# Patient Record
Sex: Male | Born: 1985 | Race: Black or African American | Hispanic: No | Marital: Single | Smoking: Never smoker
Health system: Southern US, Community
[De-identification: ages and names within clinical notes are randomized; demographics above are authoritative.]

## PROBLEM LIST (undated history)

## (undated) DIAGNOSIS — IMO0001 Reserved for inherently not codable concepts without codable children: Secondary | ICD-10-CM

## (undated) HISTORY — PX: MOUTH SURGERY: SHX715

---
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
## 2006-09-05 ENCOUNTER — Emergency Department (HOSPITAL_COMMUNITY): Admission: EM | Admit: 2006-09-05 | Discharge: 2006-09-05 | Payer: Self-pay | Admitting: Emergency Medicine

## 2006-12-04 ENCOUNTER — Emergency Department (HOSPITAL_COMMUNITY): Admission: EM | Admit: 2006-12-04 | Discharge: 2006-12-04 | Payer: Self-pay | Admitting: Emergency Medicine

## 2006-12-25 ENCOUNTER — Emergency Department (HOSPITAL_COMMUNITY): Admission: EM | Admit: 2006-12-25 | Discharge: 2006-12-25 | Payer: Self-pay | Admitting: Emergency Medicine

## 2012-02-22 ENCOUNTER — Emergency Department (HOSPITAL_COMMUNITY)
Admission: EM | Admit: 2012-02-22 | Discharge: 2012-02-22 | Disposition: A | Discharge status: Home / Self Care | Payer: Self-pay | Attending: Emergency Medicine | Admitting: Emergency Medicine

## 2012-02-22 ENCOUNTER — Encounter (HOSPITAL_COMMUNITY): Payer: Self-pay | Admitting: Emergency Medicine

## 2012-02-22 DIAGNOSIS — IMO0001 Reserved for inherently not codable concepts without codable children: Secondary | ICD-10-CM | POA: Insufficient documentation

## 2012-02-22 DIAGNOSIS — M545 Low back pain, unspecified: Secondary | ICD-10-CM

## 2012-02-22 DIAGNOSIS — M5430 Sciatica, unspecified side: Secondary | ICD-10-CM | POA: Insufficient documentation

## 2012-02-22 DIAGNOSIS — M5432 Sciatica, left side: Secondary | ICD-10-CM

## 2012-02-22 HISTORY — DX: Reserved for inherently not codable concepts without codable children: IMO0001

## 2012-02-22 MED ORDER — PREDNISONE 20 MG PO TABS
60.0000 mg | ORAL_TABLET | Freq: Once | ORAL | Status: AC
  Administered 2012-02-22: 60 mg via ORAL
  Filled 2012-02-22: qty 3

## 2012-02-22 MED ORDER — CYCLOBENZAPRINE HCL 10 MG PO TABS
10.0000 mg | ORAL_TABLET | Freq: Once | ORAL | Status: AC
  Administered 2012-02-22: 10 mg via ORAL
  Filled 2012-02-22: qty 1

## 2012-02-22 MED ORDER — PREDNISONE 20 MG PO TABS: 60.0000 mg | ORAL_TABLET | Freq: Once | ORAL | Status: AC

## 2012-02-22 MED ORDER — CYCLOBENZAPRINE HCL 10 MG PO TABS: 10.0000 mg | ORAL_TABLET | Freq: Three times a day (TID) | ORAL | Status: AC | PRN

## 2012-02-22 NOTE — ED Provider Notes (Signed)
History     CSN: [id number]  Arrival date & time 02/22/12  1919   First MD Initiated Contact with Patient 02/22/12 2054      Chief Complaint  Patient presents with  . Back Pain    (Consider location/radiation/quality/duration/timing/severity/associated sxs/prior treatment) HPI Comments: Patient here with lower back pain - states that has a history of chronic lower back pain with radiation down his left leg to his knee - states no fever, chills, nausea, vomiting, loss of control of bowels or bladder, weakness, numbness or tingling.  Pain worse with movement.  Patient is a 26 y.o. male presenting with back pain. The history is provided by the patient. No language interpreter was used.  Back Pain  This is a new problem. The current episode started yesterday. The problem occurs constantly. The problem has not changed since onset.The pain is associated with no known injury. The pain is present in the lumbar spine. The quality of the pain is described as stabbing and shooting. The pain radiates to the left thigh. The pain is at a severity of 5/10. The pain is moderate. The symptoms are aggravated by bending and twisting. The pain is the same all the time. Stiffness is present all day. Associated symptoms include leg pain. Pertinent negatives include no chest pain, no fever, no numbness, no weight loss, no headaches, no abdominal pain, no abdominal swelling, no bowel incontinence, no perianal numbness, no bladder incontinence, no dysuria, no pelvic pain, no paresthesias, no paresis, no tingling and no weakness. He has tried nothing for the symptoms. The treatment provided no relief.    Past Medical History  Diagnosis Date  . No significant past medical history     Past Surgical History  Procedure Date  . Mouth surgery     History reviewed. No pertinent family history.  History  Substance Use Topics  . Smoking status: Never Smoker   . Smokeless tobacco: Not on file  . Alcohol Use: No       Review of Systems  Constitutional: Negative for fever and weight loss.  Cardiovascular: Negative for chest pain.  Gastrointestinal: Negative for abdominal pain and bowel incontinence.  Genitourinary: Negative for bladder incontinence, dysuria and pelvic pain.  Musculoskeletal: Positive for back pain.  Neurological: Negative for tingling, weakness, numbness, headaches and paresthesias.  All other systems reviewed and are negative.    Allergies  Review of patient's allergies indicates no known allergies.  Home Medications  No current outpatient prescriptions on file.  BP 127/74  Pulse 99  Temp(Src) 98.5 F (36.9 C) (Oral)  Resp 18  SpO2 98%  Physical Exam  Nursing note and vitals reviewed. Constitutional: He is oriented to person, place, and time. He appears well-developed and well-nourished. No distress.  HENT:  Head: Normocephalic and atraumatic.  Right Ear: External ear normal.  Left Ear: External ear normal.  Nose: Nose normal.  Mouth/Throat: Oropharynx is clear and moist. No oropharyngeal exudate.  Eyes: Conjunctivae are normal. Pupils are equal, round, and reactive to light. No scleral icterus.  Neck: Normal range of motion. Neck supple. Muscular tenderness present. No spinous process tenderness present.    Cardiovascular: Normal rate, regular rhythm and normal heart sounds.  Exam reveals no gallop and no friction rub.   No murmur heard. Pulmonary/Chest: Effort normal and breath sounds normal. He exhibits no tenderness.  Abdominal: Soft. Bowel sounds are normal. He exhibits no distension. There is no tenderness.  Musculoskeletal: Normal range of motion. He exhibits tenderness. He exhibits no  edema.       Lumbar back: He exhibits tenderness, pain and spasm. He exhibits normal range of motion and no bony tenderness.       Back:  Lymphadenopathy:    He has no cervical adenopathy.  Neurological: He is alert and oriented to person, place, and time. No cranial  nerve deficit.  Skin: Skin is warm and dry. No rash noted. No erythema. No pallor.  Psychiatric: He has a normal mood and affect. His behavior is normal. Judgment and thought content normal.    ED Course  Procedures (including critical care time)  Labs Reviewed - No data to display No results found.   Sciatica    MDM  Patient with MSK lower back pain without evidence of neurological deficits.  I do not suspect cauda equina or epidural hematoma.        Izola Price [place], [place] 02/22/12 2155

## 2012-02-22 NOTE — ED Notes (Addendum)
Patient complaining of lower back pain; describes pain as "sharp" which radiates down his leg (to his left knee) and up his spine up to his neck.  Pain is chronic; patient reports having intermittent pain for years but that the pain became increasingly worse this morning.  Patient denies any injury.

## 2012-02-22 NOTE — Discharge Instructions (Signed)
Back Pain, Adult Low back pain is very common. About 1 in 5 people have back pain.The cause of low back pain is rarely dangerous. The pain often gets better over time.About half of people with a sudden onset of back pain feel better in just 2 weeks. About 8 in 10 people feel better by 6 weeks.  CAUSES Some common causes of back pain include:  Strain of the muscles or ligaments supporting the spine.   Wear and tear (degeneration) of the spinal discs.   Arthritis.   Direct injury to the back.  DIAGNOSIS Most of the time, the direct cause of low back pain is not known.However, back pain can be treated effectively even when the exact cause of the pain is unknown.Answering your caregiver's questions about your overall health and symptoms is one of the most accurate ways to make sure the cause of your pain is not dangerous. If your caregiver needs more information, he or she may order lab work or imaging tests (X-rays or MRIs).However, even if imaging tests show changes in your back, this usually does not require surgery. HOME CARE INSTRUCTIONS For many people, back pain returns.Since low back pain is rarely dangerous, it is often a condition that people can learn to manageon their own.   Remain active. It is stressful on the back to sit or stand in one place. Do not sit, drive, or stand in one place for more than 30 minutes at a time. Take short walks on level surfaces as soon as pain allows.Try to increase the length of time you walk each day.   Do not stay in bed.Resting more than 1 or 2 days can delay your recovery.   Do not avoid exercise or work.Your body is made to move.It is not dangerous to be active, even though your back may hurt.Your back will likely heal faster if you return to being active before your pain is gone.   Pay attention to your body when you bend and lift. Many people have less discomfortwhen lifting if they bend their knees, keep the load close to their  bodies,and avoid twisting. Often, the most comfortable positions are those that put less stress on your recovering back.   Find a comfortable position to sleep. Use a firm mattress and lie on your side with your knees slightly bent. If you lie on your back, put a pillow under your knees.   Only take over-the-counter or prescription medicines as directed by your caregiver. Over-the-counter medicines to reduce pain and inflammation are often the most helpful.Your caregiver may prescribe muscle relaxant drugs.These medicines help dull your pain so you can more quickly return to your normal activities and healthy exercise.   Put ice on the injured area.   Put ice in a plastic bag.   Place a towel between your skin and the bag.   Leave the ice on for 15 to 20 minutes, 3 to 4 times a day for the first 2 to 3 days. After that, ice and heat may be alternated to reduce pain and spasms.   Ask your caregiver about trying back exercises and gentle massage. This may be of some benefit.   Avoid feeling anxious or stressed.Stress increases muscle tension and can worsen back pain.It is important to recognize when you are anxious or stressed and learn ways to manage it.Exercise is a great option.  SEEK MEDICAL CARE IF:  You have pain that is not relieved with rest or medicine.   You have   relieved with rest or medicine.   You have pain that does not improve in 1 week.   You have new symptoms.   You are generally not feeling well.  SEEK IMMEDIATE MEDICAL CARE IF:    You have pain that radiates from your back into your legs.   You develop new bowel or bladder control problems.   You have unusual weakness or numbness in your arms or legs.   You develop nausea or vomiting.   You develop abdominal pain.   You feel faint.  Document Released: 11/12/2005 Document Revised: 11/01/2011 Document Reviewed: 04/02/2011  Exit[employer] Patient Information 2012 [employer]  Sciatica  Sciatica is a weakness and/or changes in sensation (tingling, jolts, hot and cold, numbness) along  the path the sciatic nerve travels. Irritation or damage to lumbar nerve roots is often also referred to as lumbar radiculopathy.   Lumbar radiculopathy (Sciatica) is the most common form of this problem. Radiculopathy can occur in any of the nerves coming out of the spinal cord. The problems caused depend on which nerves are involved. The sciatic nerve is the large nerve supplying the branches of nerves going from the hip to the toes. It often causes a numbness or weakness in the skin and/or muscles that the sciatic nerve serves. It also may cause symptoms (problems) of pain, burning, tingling, or electric shock-like feelings in the path of this nerve. This usually comes from injury to the fibers that make up the sciatic nerve. Some of these symptoms are low back pain and/or unpleasant feelings in the following areas:   From the mid-buttock down the back of the leg to the back of the knee.   And/or the outside of the calf and top of the foot.   And/or behind the inner ankle to the sole of the foot.  CAUSES    Herniated or slipped disc. Discs are the little cushions between the bones in the back.   Pressure by the piriformis muscle in the buttock on the sciatic nerve (Piriformis Syndrome).   Misalignment of the bones in the lower back and buttocks (Sacroiliac Joint Derangement).   Narrowing of the spinal canal that puts pressure on or pinches the fibers that make up the sciatic nerve.   A slipped vertebra that is out of line with those above or beneath it.   Abnormality of the nervous system itself so that nerve fibers do not transmit signals properly, especially to feet and calves (neuropathy).   Tumor (this is rare).  Your caregiver can usually determine the cause of your sciatica and begin the treatment most likely to help you.  TREATMENT   Taking over-the-counter painkillers, physical therapy, rest, exercise, spinal manipulation, and injections of anesthetics and/or steroids may be used. Surgery,  acupuncture, and Yoga can also be effective. Mind over matter techniques, mental imagery, and changing factors such as your bed, chair, desk height, posture, and activities are other treatments that may be helpful. You and your caregiver can help determine what is best for you. With proper diagnosis, the cause of most sciatica can be identified and removed. Communication and cooperation between your caregiver and you is essential. If you are not successful immediately, do not be discouraged. With time, a proper treatment can be found that will make you comfortable.  HOME CARE INSTRUCTIONS    If the pain is coming from a problem in the back, applying ice to that area for 15 to 20 minutes, 3 to 4 times per day while   awake, may be helpful. Put the ice in a plastic bag. Place a towel between the bag of ice and your skin.   You may exercise or perform your usual activities if these do not aggravate your pain, or as suggested by your caregiver.   Only take over-the-counter or prescription medicines for pain, discomfort, or fever as directed by your caregiver.   If your caregiver has given you a follow-up appointment, it is very important to keep that appointment. Not keeping the appointment could result in a chronic or permanent injury, pain, and disability. If there is any problem keeping the appointment, you must call back to this facility for assistance.  SEEK IMMEDIATE MEDICAL CARE IF:    You experience loss of control of bowel or bladder.   You have increasing weakness in the trunk, buttocks, or legs.   There is numbness in any areas from the hip down to the toes.   You have difficulty walking or keeping your balance.   You have any of the above, with fever or forceful vomiting.  Document Released: 11/06/2001 Document Revised: 11/01/2011 Document Reviewed: 06/25/2008  [employer] Patient Information 2012 [employer].

## 2012-02-22 NOTE — ED Provider Notes (Signed)
Medical screening examination/treatment/procedure(s) were performed by non-physician practitioner and as supervising physician I was immediately available for consultation/collaboration.  Juliet Rude. Rubin Payor, MD 02/22/12 2324

## 2012-12-02 ENCOUNTER — Emergency Department (HOSPITAL_COMMUNITY): Payer: Self-pay

## 2012-12-02 ENCOUNTER — Encounter (HOSPITAL_COMMUNITY): Payer: Self-pay | Admitting: Emergency Medicine

## 2012-12-02 ENCOUNTER — Emergency Department (HOSPITAL_COMMUNITY)
Admission: EM | Admit: 2012-12-02 | Discharge: 2012-12-03 | Disposition: A | Discharge status: Home / Self Care | Payer: Self-pay | Attending: Emergency Medicine | Admitting: Emergency Medicine

## 2012-12-02 DIAGNOSIS — R112 Nausea with vomiting, unspecified: Secondary | ICD-10-CM | POA: Insufficient documentation

## 2012-12-02 DIAGNOSIS — J029 Acute pharyngitis, unspecified: Secondary | ICD-10-CM | POA: Insufficient documentation

## 2012-12-02 DIAGNOSIS — J3489 Other specified disorders of nose and nasal sinuses: Secondary | ICD-10-CM | POA: Insufficient documentation

## 2012-12-02 DIAGNOSIS — B9789 Other viral agents as the cause of diseases classified elsewhere: Secondary | ICD-10-CM | POA: Insufficient documentation

## 2012-12-02 DIAGNOSIS — R52 Pain, unspecified: Secondary | ICD-10-CM | POA: Insufficient documentation

## 2012-12-02 DIAGNOSIS — E860 Dehydration: Secondary | ICD-10-CM | POA: Insufficient documentation

## 2012-12-02 DIAGNOSIS — M549 Dorsalgia, unspecified: Secondary | ICD-10-CM | POA: Insufficient documentation

## 2012-12-02 DIAGNOSIS — B349 Viral infection, unspecified: Secondary | ICD-10-CM

## 2012-12-02 DIAGNOSIS — Z79899 Other long term (current) drug therapy: Secondary | ICD-10-CM | POA: Insufficient documentation

## 2012-12-02 LAB — RAPID STREP SCREEN (MED CTR MEBANE ONLY): Streptococcus, Group A Screen (Direct): NEGATIVE

## 2012-12-02 MED ORDER — HYDROCODONE-ACETAMINOPHEN 5-325 MG PO TABS
1.0000 | ORAL_TABLET | Freq: Once | ORAL | Status: AC
  Administered 2012-12-02: 1 via ORAL
  Filled 2012-12-02: qty 1

## 2012-12-02 NOTE — ED Notes (Signed)
Pt alert, arrives from home, c/o body aches, fever, onset last week, seen in [facility], returns with cont fever, body aches, resp even unlabored, skin pwd

## 2012-12-02 NOTE — ED Provider Notes (Signed)
History     CSN: [id number]  Arrival date & time 12/02/12  1191   First MD Initiated Contact with Patient 12/02/12 2215      Chief Complaint  Patient presents with  . Generalized Body Aches  . Fever   HPI  History provided by the patient. Patient is a 27 year old male with no significant PMH who presents with multiple complaints of fever, chills, bodyaches and sore throat for the past week. Symptoms began gradually and have been persistent. They have been associated with a few episodes of vomiting. Denies any diarrhea symptoms. Patient also reports slight occasional productive cough. Sore throat is worse with swallowing. Patient has taken Flexeril and prednisone he was given previously for his back pain without any improvement of symptoms. He denies any other aggravating or alleviating factors. Denies any other associated symptoms. Denies any recent travel or known sick contacts.    Past Medical History  Diagnosis Date  . No significant past medical history     Past Surgical History  Procedure Date  . Mouth surgery     No family history on file.  History  Substance Use Topics  . Smoking status: Never Smoker   . Smokeless tobacco: Not on file  . Alcohol Use: No      Review of Systems  Constitutional: Positive for fever and chills. Negative for appetite change.  HENT: Positive for sore throat and rhinorrhea. Negative for ear pain.   Respiratory: Positive for cough.   Gastrointestinal: Positive for nausea and vomiting. Negative for abdominal pain.  Musculoskeletal: Positive for myalgias and back pain.  All other systems reviewed and are negative.    Allergies  Review of patient's allergies indicates no known allergies.  Home Medications   Current Outpatient Rx  Name  Route  Sig  Dispense  Refill  . CYCLOBENZAPRINE HCL 10 MG PO TABS   Oral   Take 10 mg by mouth 3 (three) times daily as needed. Muscle spasms         . IBUPROFEN 200 MG PO TABS   Oral   Take  200 mg by mouth every 6 (six) hours as needed. Pain         . PREDNISONE 20 MG PO TABS   Oral   Take 60 mg by mouth daily.           BP 114/70  Pulse 108  Temp 99.2 F (37.3 C) (Oral)  Resp 17  Wt 170 lb (77.111 kg)  SpO2 98%  Physical Exam  Nursing note and vitals reviewed. Constitutional: He is oriented to person, place, and time. He appears well-developed and well-nourished. No distress.  HENT:  Head: Normocephalic.       Bilateral tonsillar edema with erythema and exudate. Uvula midline. No signs for PTA.  Cardiovascular: Normal rate and regular rhythm.   Pulmonary/Chest: Effort normal and breath sounds normal. No respiratory distress. He has no wheezes. He has no rales.  Abdominal: Soft. There is no tenderness. There is no rebound and no guarding.  Musculoskeletal:       Cervical back: Normal.       Thoracic back: Normal.       Lumbar back: He exhibits tenderness. He exhibits no bony tenderness.       Back:  Lymphadenopathy:    He has cervical adenopathy.  Neurological: He is alert and oriented to person, place, and time.  Skin: Skin is warm.    ED Course  Procedures   Results for orders  placed during the hospital encounter of 12/02/12  URINALYSIS, ROUTINE W REFLEX MICROSCOPIC      Component Value Range   Color, Urine AMBER (*) YELLOW   APPearance CLOUDY (*) CLEAR   Specific Gravity, Urine 1.041 (*) 1.005 - 1.030   pH 6.0  5.0 - 8.0   Glucose, UA NEGATIVE  NEGATIVE mg/dL   Hgb urine dipstick NEGATIVE  NEGATIVE   Bilirubin Urine SMALL (*) NEGATIVE   Ketones, ur 40 (*) NEGATIVE mg/dL   Protein, ur 161 (*) NEGATIVE mg/dL   Urobilinogen, UA 1.0  0.0 - 1.0 mg/dL   Nitrite NEGATIVE  NEGATIVE   Leukocytes, UA NEGATIVE  NEGATIVE  RAPID STREP SCREEN      Component Value Range   Streptococcus, Group A Screen (Direct) NEGATIVE  NEGATIVE  URINE MICROSCOPIC-ADD ON      Component Value Range   Squamous Epithelial / LPF FEW (*) RARE   Bacteria, UA FEW (*) RARE     Urine-Other MUCOUS PRESENT    POCT I-STAT, CHEM 8      Component Value Range   Sodium 139  135 - 145 mEq/L   Potassium 3.4 (*) 3.5 - 5.1 mEq/L   Chloride 100  96 - 112 mEq/L   BUN 9  6 - 23 mg/dL   Creatinine, Ser 0.96  0.50 - 1.35 mg/dL   Glucose, Bld 045 (*) 70 - 99 mg/dL   Calcium, Ion 4.09  8.11 - 1.23 mmol/L   TCO2 28  0 - 100 mmol/L   Hemoglobin 15.0  13.0 - 17.0 g/dL   HCT 91.4  78.2 - 95.6 %      Dg Lumbar Spine Complete  12/02/2012  *RADIOLOGY REPORT*  Clinical Data: Low back pain radiating into both legs.  LUMBAR SPINE - COMPLETE 4+ VIEW  Comparison: Plain films 12/25/2006.  Findings: Vertebral body height and alignment are normal.  No pars interarticularis defect is identified.  Intervertebral disc space height is maintained.  There is straightening of the normal lumbar lordosis.  IMPRESSION: No acute finding.  Straightening of the normal lumbar lordosis.   Original Report Authenticated By: Holley Dexter, M.D.      1. Viral infection   2. Dehydration       MDM  Patient seen and evaluated. Patient well-appearing and nontoxic.        [name], [place] 12/03/12 [phone]

## 2012-12-02 NOTE — ED Notes (Signed)
Pt c/o back pain which radiates down the back of both legs into his groin area and knees.  Pt has had cold sweats and fever since Sunday.  Patient has sore throat and vomited one time tonight.  Denies diarrhea.  Not having nausea now.  Pt had same kind of back pain last year and was given a muscle relaxer and prednisone.  Pt has been taking his medication from last year without relief.  No injury reported.  No pain or problems urinating.  Pt coughing up yellow phlegm occasionally.

## 2012-12-03 LAB — POCT I-STAT, CHEM 8
Calcium, Ion: 1.14 mmol/L (ref 1.12–1.23)
Creatinine, Ser: 1.3 mg/dL (ref 0.50–1.35)
Hemoglobin: 15 g/dL (ref 13.0–17.0)
Sodium: 139 mEq/L (ref 135–145)
TCO2: 28 mmol/L (ref 0–100)

## 2012-12-03 LAB — URINALYSIS, ROUTINE W REFLEX MICROSCOPIC
Glucose, UA: NEGATIVE mg/dL
Hgb urine dipstick: NEGATIVE
Leukocytes, UA: NEGATIVE
Specific Gravity, Urine: 1.041 — ABNORMAL HIGH (ref 1.005–1.030)
Urobilinogen, UA: 1 mg/dL (ref 0.0–1.0)

## 2012-12-03 LAB — URINE MICROSCOPIC-ADD ON

## 2012-12-03 MED ORDER — SODIUM CHLORIDE 0.9 % IV BOLUS (SEPSIS)
1000.0000 mL | Freq: Once | INTRAVENOUS | Status: AC
  Administered 2012-12-03: 1000 mL via INTRAVENOUS

## 2012-12-03 NOTE — ED Notes (Signed)
Peripheral IV to R AC started at 0059 during downtime by A Ward, RN

## 2012-12-03 NOTE — ED Provider Notes (Signed)
Medical screening examination/treatment/procedure(s) were performed by non-physician practitioner and as supervising physician I was immediately available for consultation/collaboration.  Marwan T Powers, MD 12/03/12 2349 

## 2012-12-03 NOTE — ED Notes (Signed)
Patient unable to urinate at this time. 

## 2012-12-05 ENCOUNTER — Emergency Department (HOSPITAL_COMMUNITY)
Admission: EM | Admit: 2012-12-05 | Discharge: 2012-12-05 | Disposition: A | Discharge status: Home / Self Care | Payer: Self-pay | Attending: Emergency Medicine | Admitting: Emergency Medicine

## 2012-12-05 ENCOUNTER — Encounter (HOSPITAL_COMMUNITY): Payer: Self-pay | Admitting: Emergency Medicine

## 2012-12-05 DIAGNOSIS — J029 Acute pharyngitis, unspecified: Secondary | ICD-10-CM | POA: Insufficient documentation

## 2012-12-05 DIAGNOSIS — R220 Localized swelling, mass and lump, head: Secondary | ICD-10-CM | POA: Insufficient documentation

## 2012-12-05 DIAGNOSIS — R1310 Dysphagia, unspecified: Secondary | ICD-10-CM | POA: Insufficient documentation

## 2012-12-05 DIAGNOSIS — R6883 Chills (without fever): Secondary | ICD-10-CM | POA: Insufficient documentation

## 2012-12-05 MED ORDER — ACETAMINOPHEN-CODEINE 120-12 MG/5ML PO SOLN: 10.0000 mL | ORAL | Status: DC | PRN

## 2012-12-05 MED ORDER — AMOXICILLIN 875 MG PO TABS: 875.0000 mg | ORAL_TABLET | Freq: Two times a day (BID) | ORAL | Status: DC

## 2012-12-05 NOTE — ED Provider Notes (Signed)
History     CSN: [id number]  Arrival date & time 12/05/12  1124   First MD Initiated Contact with Patient 12/05/12 1149      Chief Complaint  Patient presents with  . Sore Throat    (Consider location/radiation/quality/duration/timing/severity/associated sxs/prior treatment) HPI Patient presents to the emergency department today complaining of continuing sore throat, dysphagia, and tonsillar swelling for the past 5 days. He was seen here 2 days ago, and during that visit he was dehydrated, had an elevated temperature, and had been vomiting. Since then, the vomiting has ceased, and now the only thing that is bothering him is his throat. He describes the pain as burning and rates it a 7/10. He feels like his tonsils are swollen and partially obstructing his ability to swallow, and occasionally obstructs his breathing. He also complains of chills at night. He has tried warm lemon water gargles, which stings. He has not tried any other over-the-counter medications.   Past Medical History  Diagnosis Date  . No significant past medical history     Past Surgical History  Procedure Date  . Mouth surgery     No family history on file.  History  Substance Use Topics  . Smoking status: Never Smoker   . Smokeless tobacco: Not on file  . Alcohol Use: No      Review of Systems ROSAll other systems negative except as documented in the HPI. All pertinent positives and negatives as reviewed in the HPI.  Allergies  Review of patient's allergies indicates no known allergies.  Home Medications   Current Outpatient Rx  Name  Route  Sig  Dispense  Refill  . CYCLOBENZAPRINE HCL 10 MG PO TABS   Oral   Take 10 mg by mouth 3 (three) times daily as needed. Muscle spasms         . PREDNISONE 20 MG PO TABS   Oral   Take 60 mg by mouth daily.           BP 125/59  Pulse 114  Temp 98.6 F (37 C) (Oral)  Resp 20  SpO2 100%  Physical Exam  Constitutional: He is oriented to  person, place, and time. Vital signs are normal. He appears well-developed and well-nourished.  HENT:  Head: Normocephalic and atraumatic.  Mouth/Throat: Uvula is midline. Oropharyngeal exudate, posterior oropharyngeal edema and posterior oropharyngeal erythema present.  Eyes: Pupils are equal, round, and reactive to light.  Neck: Neck supple.  Cardiovascular: Regular rhythm, S1 normal and S2 normal.  Tachycardia present.        Split S2  Pulmonary/Chest: Effort normal and breath sounds normal. No respiratory distress.  Lymphadenopathy:    He has cervical adenopathy.  Neurological: He is alert and oriented to person, place, and time.  Skin: Skin is warm and dry. He is not diaphoretic.    ED Course  Procedures (including critical care time)   Labs Reviewed  RAPID STREP SCREEN   Patient will be treated for pharyngitis. The patient is advised to increase his fluids. Patient will be given symptomatic relief.   MDM          Carlyle Dolly, PA-C 12/05/12 [phone]

## 2012-12-05 NOTE — ED Notes (Signed)
Pt presenting to ed with c/o sore throat pt states he was seen here x 3 days ago for the same pt states he doesn't feel any better he thinks that his symptoms are worse.

## 2012-12-05 NOTE — Progress Notes (Signed)
Pt listed with no insurance coverage CM and [facility] liaison spoke with pt.  Pt offered services to assist with finding a [place] self pay provider & health reform information 

## 2012-12-08 NOTE — ED Provider Notes (Signed)
Medical screening examination/treatment/procedure(s) were performed by non-physician practitioner and as supervising physician I was immediately available for consultation/collaboration.   Richardean Canal, MD 12/08/12 [phone]

## 2013-05-26 ENCOUNTER — Emergency Department (HOSPITAL_COMMUNITY)
Admission: EM | Admit: 2013-05-26 | Discharge: 2013-05-27 | Disposition: A | Discharge status: Home / Self Care | Payer: BC Managed Care – PPO | Attending: Emergency Medicine | Admitting: Emergency Medicine

## 2013-05-26 DIAGNOSIS — W230XXA Caught, crushed, jammed, or pinched between moving objects, initial encounter: Secondary | ICD-10-CM | POA: Insufficient documentation

## 2013-05-26 DIAGNOSIS — Y929 Unspecified place or not applicable: Secondary | ICD-10-CM | POA: Insufficient documentation

## 2013-05-26 DIAGNOSIS — Y939 Activity, unspecified: Secondary | ICD-10-CM | POA: Insufficient documentation

## 2013-05-26 DIAGNOSIS — S92911A Unspecified fracture of right toe(s), initial encounter for closed fracture: Secondary | ICD-10-CM

## 2013-05-26 DIAGNOSIS — S92919A Unspecified fracture of unspecified toe(s), initial encounter for closed fracture: Secondary | ICD-10-CM | POA: Insufficient documentation

## 2013-05-26 NOTE — ED Provider Notes (Signed)
History    This chart was scribed for non-physician practitioner Junious Silk, PA working with Olivia Mackie, MD by Quintella Reichert, ED Scribe. This patient was seen in room WTR9/WTR9 and the patient's care was started at 11:50 PM .  CSN: [id number]  Arrival date & time 05/26/13  2347    Chief Complaint  Patient presents with  . Toe Injury    The history is provided by the patient. No language interpreter was used.    HPI Comments: Bryan Walsh is a 27 y.o. male who presents to the Emergency Department complaining of a right great toe injury that he sustained 6 hours ago.  Pt reports that he accidentally jammed the toe on a trash can and immediately developed constant, severe pain to the toe, with accompanying visible swelling and bruising.  Pain is exacerbated by movement and bearing weight.  He has attempted to treat pain with ibuprofen, without relief.  He denies h/o prior injuries to the toe.  He denies fever, chills, abdominal pain, nausea, emesis, CP, SOB or any other associated symptoms.   Past Medical History  Diagnosis Date  . No significant past medical history    Past Surgical History  Procedure Laterality Date  . Mouth surgery     No family history on file. History  Substance Use Topics  . Smoking status: Never Smoker   . Smokeless tobacco: Not on file  . Alcohol Use: No    Review of Systems  Constitutional: Negative for fever and chills.  Respiratory: Negative for shortness of breath.   Cardiovascular: Negative for chest pain.  Gastrointestinal: Negative for nausea, vomiting and abdominal pain.  Musculoskeletal:       Right big toe pain and swelling  All other systems reviewed and are negative.    Allergies  Review of patient's allergies indicates no known allergies.  Home Medications   Current Outpatient Rx  Name  Route  Sig  Dispense  Refill  . acetaminophen-codeine 120-12 MG/5ML solution   Oral   Take 10 mLs by mouth every 4 (four) hours  as needed for pain.   120 mL   0   . amoxicillin (AMOXIL) 875 MG tablet   Oral   Take 1 tablet (875 mg total) by mouth 2 (two) times daily.   20 tablet   0   . cyclobenzaprine (FLEXERIL) 10 MG tablet   Oral   Take 10 mg by mouth 3 (three) times daily as needed. Muscle spasms         . predniSONE (DELTASONE) 20 MG tablet   Oral   Take 60 mg by mouth daily.          BP 142/91  Pulse 89  Temp(Src) 99 F (37.2 C) (Oral)  Resp 17  SpO2 100%  Physical Exam  Nursing note and vitals reviewed. Constitutional: He is oriented to person, place, and time. He appears well-developed and well-nourished. No distress.  HENT:  Head: Normocephalic and atraumatic.  Right Ear: External ear normal.  Left Ear: External ear normal.  Nose: Nose normal.  Eyes: Conjunctivae are normal.  Neck: Normal range of motion. No tracheal deviation present.  Cardiovascular: Normal rate, regular rhythm and normal heart sounds.   Pulmonary/Chest: Effort normal and breath sounds normal. No stridor.  Abdominal: Soft. He exhibits no distension. There is no tenderness.  Musculoskeletal: Normal range of motion.  Swollen right great toe with bruising and tenderness to palpation. ROM limited due to pain. Compartment soft.  Neurological:  He is alert and oriented to person, place, and time.  Neurovascularly intact  Skin: Skin is warm and dry. He is not diaphoretic.  Psychiatric: He has a normal mood and affect. His behavior is normal.    ED Course  Procedures (including critical care time)  DIAGNOSTIC STUDIES: Oxygen Saturation is 100% on room air, normal by my interpretation.    COORDINATION OF CARE: 11:55 PM: Discussed treatment plan which includes pain medication and imaging.  Pt expressed understanding and agreed to plan.    Labs Reviewed - No data to display  Dg Foot Complete Right  05/27/2013   *RADIOLOGY REPORT*  Clinical Data: Great toe pain.  RIGHT FOOT COMPLETE - 3+ VIEW  Comparison: None.   Findings: There is a nondisplaced transverse fracture of the great toe terminal phalanx base extending intra-articular.  The alignment remains anatomic.  Proximal phalanx appears normal.  IMPRESSION: Nondisplaced intra-articular transverse fracture of the right great toe terminal phalanx base.   Original Report Authenticated By: Andreas Newport, M.D.    1. Toe fracture, right, closed, initial encounter     MDM  Patient with nondisplaced intra-articular transverse fracture of the right great toe. Post op shoe, toes buddy taped, and crutches given. Rx for pain medication. Discussed smoking cessation and the damage smoking does not only to the lungs, but also the impairment on bone healing. Follow up with PCP. Return instructions given. Vital signs stable for discharge. Patient / Family / Caregiver informed of clinical course, understand medical decision-making process, and agree with plan.     I personally performed the services described in this documentation, which was scribed in my presence. The recorded information has been reviewed and is accurate.     Mora Bellman, PA-C 05/27/13 [phone]

## 2013-05-26 NOTE — ED Notes (Signed)
Pt states he injured his R great toe on a trash can. Pt has swelling and bruising to R great toe. Pt arrives in wheelchair. States it hurts too much to walk on R foot. Pt arrives with companion.

## 2013-05-27 ENCOUNTER — Emergency Department (HOSPITAL_COMMUNITY): Payer: BC Managed Care – PPO

## 2013-05-27 MED ORDER — ONDANSETRON 8 MG PO TBDP
8.0000 mg | ORAL_TABLET | Freq: Once | ORAL | Status: AC
  Administered 2013-05-27: 8 mg via ORAL
  Filled 2013-05-27: qty 1

## 2013-05-27 MED ORDER — OXYCODONE-ACETAMINOPHEN 5-325 MG PO TABS: 2.0000 | ORAL_TABLET | Freq: Four times a day (QID) | ORAL | Status: DC | PRN

## 2013-05-27 MED ORDER — PROMETHAZINE HCL 25 MG PO TABS: 25.0000 mg | ORAL_TABLET | Freq: Four times a day (QID) | ORAL | Status: DC | PRN

## 2013-05-27 MED ORDER — OXYCODONE-ACETAMINOPHEN 5-325 MG PO TABS
2.0000 | ORAL_TABLET | Freq: Once | ORAL | Status: AC
  Administered 2013-05-27: 2 via ORAL
  Filled 2013-05-27: qty 2

## 2013-05-27 NOTE — ED Notes (Signed)
Patient transported to X-ray 

## 2013-05-27 NOTE — ED Provider Notes (Signed)
Medical screening examination/treatment/procedure(s) were performed by non-physician practitioner and as supervising physician I was immediately available for consultation/collaboration.  Azarian Starace M Adrien Dietzman, MD 05/27/13 0325 

## 2013-08-12 ENCOUNTER — Emergency Department (HOSPITAL_COMMUNITY)
Admission: EM | Admit: 2013-08-12 | Discharge: 2013-08-12 | Disposition: A | Discharge status: Home / Self Care | Payer: BC Managed Care – PPO | Attending: Emergency Medicine | Admitting: Emergency Medicine

## 2013-08-12 ENCOUNTER — Emergency Department (HOSPITAL_COMMUNITY): Payer: BC Managed Care – PPO

## 2013-08-12 ENCOUNTER — Encounter (HOSPITAL_COMMUNITY): Payer: Self-pay | Admitting: Emergency Medicine

## 2013-08-12 DIAGNOSIS — M436 Torticollis: Secondary | ICD-10-CM | POA: Insufficient documentation

## 2013-08-12 DIAGNOSIS — IMO0002 Reserved for concepts with insufficient information to code with codable children: Secondary | ICD-10-CM | POA: Insufficient documentation

## 2013-08-12 MED ORDER — METHOCARBAMOL 500 MG PO TABS: 500.0000 mg | ORAL_TABLET | Freq: Two times a day (BID) | ORAL | Status: DC

## 2013-08-12 MED ORDER — IBUPROFEN 800 MG PO TABS: 800.0000 mg | ORAL_TABLET | Freq: Three times a day (TID) | ORAL | Status: DC

## 2013-08-12 NOTE — ED Notes (Addendum)
Pt reports having 10/10 posterior neck and upper back pain. Pt reports the pain has been there for over one year. Pt reports the pain worsens with turning his head from side to side. Pt denies any relieving factors. Pt is requesting a referral to a neck and spine specialist. Pt is A/O x4 and in NAD.

## 2013-08-12 NOTE — ED Provider Notes (Signed)
Medical screening examination/treatment/procedure(s) were performed by non-physician practitioner and as supervising physician I was immediately available for consultation/collaboration.   Glynn Octave, MD 08/12/13 [phone]

## 2013-08-12 NOTE — ED Provider Notes (Signed)
CSN: [id number]     Arrival date & time 08/12/13  1947 History   This chart was scribed for non-physician practitioner Fayrene Helper, PA, working with Glynn Octave, MD, by Allene Dillon, ED Scribe. This patient was seen in room WTR6/WTR6 and the patient's care was started at 1947.    Chief Complaint  Patient presents with  . Neck Pain    The history is provided by the patient. No language interpreter was used.   HPI Comments: Bryan Walsh is a 27 y.o. male who presents to the Emergency Department complaining of several years of ongoing, intermittent, sharp posterior neck pain. He states this pain radiated down to the mid thoracic spine. He denies any recent injuries or trauma to the affected area. Pt states pain is worse with certain movements. Pain is alleviated when sleeping. He states he has never been evaluated for this pain in the past.  Pt tried tylenol with no relief. Pt denies numbness, weakness, SOB, rash, fever, HA or any other symptoms.    Past Medical History  Diagnosis Date  . No significant past medical history    Past Surgical History  Procedure Laterality Date  . Mouth surgery     No family history on file. History  Substance Use Topics  . Smoking status: Never Smoker   . Smokeless tobacco: Never Used  . Alcohol Use: No    Review of Systems  Constitutional: Negative for fever.  HENT: Positive for neck pain. Negative for neck stiffness.   Respiratory: Negative for shortness of breath.   Skin: Negative for rash.  Neurological: Negative for weakness, numbness and headaches.    Allergies  Review of patient's allergies indicates no known allergies.  Home Medications   Current Outpatient Rx  Name  Route  Sig  Dispense  Refill  . HYDROcodone-acetaminophen (NORCO/VICODIN) 5-325 MG per tablet   Oral   Take 1 tablet by mouth every 6 (six) hours as needed for pain.         Marland Kitchen oxyCODONE-acetaminophen (PERCOCET/ROXICET) 5-325 MG per tablet   Oral   Take 2  tablets by mouth every 6 (six) hours as needed for pain.         . promethazine (PHENERGAN) 25 MG tablet   Oral   Take 25 mg by mouth every 6 (six) hours as needed for nausea.          Triage Vitals:BP 112/60  Pulse 65  Temp(Src) 98.6 F (37 C) (Oral)  Resp 20  Ht 6' (1.829 m)  Wt 170 lb (77.111 kg)  BMI 23.05 kg/m2  SpO2 100% Physical Exam  Nursing note and vitals reviewed. Constitutional: He appears well-developed and well-nourished. No distress.  Sits comfortably and appears non-toxic.   HENT:  Head: Normocephalic and atraumatic.  Mouth/Throat: Oropharynx is clear and moist.  No midline spine tenderness, crepitus, or step offs. Tenderness to palpation over the left paraspinal cervical and left trapezius region. Mild limited ROM to neck lateralization.  Eyes: EOM are normal. Pupils are equal, round, and reactive to light.  Neck: Normal range of motion. Neck supple.  Cardiovascular: Normal rate, regular rhythm and normal heart sounds.   Pulmonary/Chest: Effort normal and breath sounds normal. He has no wheezes.  Musculoskeletal: Normal range of motion.  Neurological: He is alert.  Skin: Skin is warm and dry.  Psychiatric: He has a normal mood and affect. His behavior is normal.    ED Course  Procedures (including critical care time) DIAGNOSTIC STUDIES: Oxygen Saturation  is 100% on RA, normal by my interpretation.    COORDINATION OF CARE: 8:59 PM-Will prescribe muscle relaxer. Pt requested x-rays. Discussed treatment plan which includes with pt at bedside and pt agreed to plan. Pt does not appear in acute distress and I have low suspension of meningitis.   Labs Review Labs Reviewed - No data to display Imaging Review Dg Cervical Spine Complete  08/12/2013   CLINICAL DATA:  Posterior neck pain no known injury.  EXAM: CERVICAL SPINE  4+ VIEWS  COMPARISON:  None.  FINDINGS: Negative for acute fracture or subluxation. Mild remodeling of the C6 and C7 vertebral bodies  with tiny osteophytes, compatible degenerative disc disease. No evidence of osseous foraminal stenosis. No prevertebral swelling.  IMPRESSION: 1. No acute osseous findings. 2. Minimal degenerative changes to the lower cervical disks.   Electronically Signed   By: Tiburcio Pea   On: 08/12/2013 21:50    MDM   1. Muscular torticollis    BP 112/60  Pulse 65  Temp(Src) 98.6 F (37 C) (Oral)  Resp 20  Ht 6' (1.829 m)  Wt 170 lb (77.111 kg)  BMI 23.05 kg/m2  SpO2 100%  I have reviewed nursing notes and vital signs. I personally reviewed the imaging tests through PACS system  I reviewed available ER/hospitalization records thought the EMR  I personally performed the services described in this documentation, which was scribed in my presence. The recorded information has been reviewed and is accurate.    Fayrene Helper, PA-C 08/12/13 2159

## 2014-01-14 ENCOUNTER — Encounter (HOSPITAL_COMMUNITY): Payer: Self-pay | Admitting: Emergency Medicine

## 2014-01-14 ENCOUNTER — Emergency Department (HOSPITAL_COMMUNITY)
Admission: EM | Admit: 2014-01-14 | Discharge: 2014-01-14 | Disposition: A | Discharge status: Home / Self Care | Payer: BC Managed Care – PPO | Attending: Emergency Medicine | Admitting: Emergency Medicine

## 2014-01-14 DIAGNOSIS — IMO0002 Reserved for concepts with insufficient information to code with codable children: Secondary | ICD-10-CM

## 2014-01-14 DIAGNOSIS — Z79899 Other long term (current) drug therapy: Secondary | ICD-10-CM | POA: Insufficient documentation

## 2014-01-14 DIAGNOSIS — L03019 Cellulitis of unspecified finger: Secondary | ICD-10-CM | POA: Insufficient documentation

## 2014-01-14 MED ORDER — TRAMADOL HCL 50 MG PO TABS: 50.0000 mg | ORAL_TABLET | Freq: Four times a day (QID) | ORAL | Status: DC | PRN

## 2014-01-14 MED ORDER — SULFAMETHOXAZOLE-TRIMETHOPRIM 800-160 MG PO TABS: ORAL_TABLET | ORAL | Status: DC

## 2014-01-14 MED ORDER — SULFAMETHOXAZOLE-TMP DS 800-160 MG PO TABS
2.0000 | ORAL_TABLET | Freq: Once | ORAL | Status: AC
  Administered 2014-01-14: 2 via ORAL
  Filled 2014-01-14: qty 2

## 2014-01-14 NOTE — ED Provider Notes (Signed)
CSN: [id number]     Arrival date & time 01/14/14  0746 History   First MD Initiated Contact with Patient 01/14/14 [phone]     Chief Complaint  Patient presents with  . Hand Pain     (Consider location/radiation/quality/duration/timing/severity/associated sxs/prior Treatment) Patient is a 28 y.o. male presenting with hand pain.  Hand Pain   Patient presents to the ER for evaluation of pain and swelling of his left middle finger that started 4 days ago. He says it started as a hangnail. The pain has progressively worsened and the area is very swollen. No drainage from the area. He denies injury. Pain is severe, kept him up all night.  Past Medical History  Diagnosis Date  . No significant past medical history    Past Surgical History  Procedure Laterality Date  . Mouth surgery     No family history on file. History  Substance Use Topics  . Smoking status: Never Smoker   . Smokeless tobacco: Never Used  . Alcohol Use: No    Review of Systems  Musculoskeletal:       Swelling finger  Skin:       Swelling finger      Allergies  Review of patient's allergies indicates no known allergies.  Home Medications   Current Outpatient Rx  Name  Route  Sig  Dispense  Refill  . ibuprofen (ADVIL,MOTRIN) 800 MG tablet   Oral   Take 1 tablet (800 mg total) by mouth 3 (three) times daily.   21 tablet   0   . methocarbamol (ROBAXIN) 500 MG tablet   Oral   Take 1 tablet (500 mg total) by mouth 2 (two) times daily.   20 tablet   0   . promethazine (PHENERGAN) 25 MG tablet   Oral   Take 25 mg by mouth every 6 (six) hours as needed for nausea.          BP 121/72  Pulse 76  Temp(Src) 98.7 F (37.1 C) (Oral)  Resp 17  SpO2 100% Physical Exam  Musculoskeletal:       Hands: Neurological: He is alert. He has normal strength. No cranial nerve deficit or sensory deficit.  Skin:       ED Course  Procedures (including critical care time) Labs Review Labs Reviewed - No  data to display Imaging Review No results found.  EKG Interpretation   None       MDM   Final diagnoses:  Paronychia   Patient ha  erythema and swelling of the distal portion of the left middle finger. He is a difficult examination because from a colonic finger secondary to pain. There is evidence of significant paronychia, cannot rule out felon. I recommended digital block followed by incision and drainage. Patient declines. I did explain the process to him, indicating that he would not have any pain with the incision after the digital block. The patient refuses. I did inform him that this will not get better with antibiotics alone, he could lose his finger if it worsens. He understands, but declines the procedure. Patient will be initiated on Bactrim, return to the ER when the symptoms worsen. He also was given a followup for hand surgery.   Gilda Creasehristopher J. Pollina, MD 01/14/14 [phone]

## 2014-01-14 NOTE — ED Notes (Signed)
Pt c/o left middle finger swelling and pain that he noticed starting on Monday. Pt states the pain kept him up last night. Pt states "Not for sure if this is from a hangnail or not".

## 2014-01-14 NOTE — Discharge Instructions (Signed)
Return to the ER or see the hand surgeon immediately if/when the pain and swelling worsen.  Fingertip Infection When an infection is around the nail, it is called a paronychia. When it appears over the tip of the finger, it is called a felon. These infections are due to minor injuries or cracks in the skin. If they are not treated properly, they can lead to bone infection and permanent damage to the fingernail. Incision and drainage is necessary if a pus pocket (an abscess) has formed. Antibiotics and pain medicine may also be needed. Keep your hand elevated for the next 2-3 days to reduce swelling and pain. If a pack was placed in the abscess, it should be removed in 1-2 days by your caregiver. Soak the finger in warm water for 20 minutes 4 times daily to help promote drainage. Keep the hands as dry as possible. Wear protective gloves with cotton liners. See your caregiver for follow-up care as recommended.  HOME CARE INSTRUCTIONS   Keep wound clean, dry and dressed as suggested by your caregiver.  Soak in warm salt water for fifteen minutes, four times per day for bacterial infections.  Your caregiver will prescribe an antibiotic if a bacterial infection is suspected. Take antibiotics as directed and finish the prescription, even if the problem appears to be improving before the medicine is gone.  Only take over-the-counter or prescription medicines for pain, discomfort, or fever as directed by your caregiver. SEEK IMMEDIATE MEDICAL CARE IF:  There is redness, swelling, or increasing pain in the wound.  Pus or any other unusual drainage is coming from the wound.  An unexplained oral temperature above 102 F (38.9 C) develops.  You notice a foul smell coming from the wound or dressing. MAKE SURE YOU:   Understand these instructions.  Monitor your condition.  Contact your caregiver if you are getting worse or not improving. Document Released: 12/20/2004 Document Revised: 02/04/2012  Document Reviewed: 12/16/2008 [facility] Patient Information 2014 [employer], [place].

## 2014-01-14 NOTE — ED Notes (Addendum)
Pt middle index finger is swollen, red, and sore to touch.

## 2014-04-14 ENCOUNTER — Emergency Department (HOSPITAL_COMMUNITY): Payer: Self-pay

## 2014-04-14 ENCOUNTER — Emergency Department (HOSPITAL_COMMUNITY): Payer: BC Managed Care – PPO

## 2014-04-14 ENCOUNTER — Encounter (HOSPITAL_COMMUNITY): Payer: Self-pay | Admitting: Emergency Medicine

## 2014-04-14 ENCOUNTER — Emergency Department (HOSPITAL_COMMUNITY)
Admission: EM | Admit: 2014-04-14 | Discharge: 2014-04-14 | Disposition: A | Discharge status: Home / Self Care | Payer: BC Managed Care – PPO | Attending: Emergency Medicine | Admitting: Emergency Medicine

## 2014-04-14 DIAGNOSIS — S5001XA Contusion of right elbow, initial encounter: Secondary | ICD-10-CM

## 2014-04-14 DIAGNOSIS — S40011A Contusion of right shoulder, initial encounter: Secondary | ICD-10-CM

## 2014-04-14 DIAGNOSIS — W1789XA Other fall from one level to another, initial encounter: Secondary | ICD-10-CM | POA: Insufficient documentation

## 2014-04-14 DIAGNOSIS — S60211A Contusion of right wrist, initial encounter: Secondary | ICD-10-CM

## 2014-04-14 DIAGNOSIS — S60219A Contusion of unspecified wrist, initial encounter: Secondary | ICD-10-CM | POA: Insufficient documentation

## 2014-04-14 DIAGNOSIS — S40811A Abrasion of right upper arm, initial encounter: Secondary | ICD-10-CM

## 2014-04-14 DIAGNOSIS — S5000XA Contusion of unspecified elbow, initial encounter: Secondary | ICD-10-CM | POA: Insufficient documentation

## 2014-04-14 DIAGNOSIS — Y9289 Other specified places as the place of occurrence of the external cause: Secondary | ICD-10-CM | POA: Insufficient documentation

## 2014-04-14 DIAGNOSIS — S40019A Contusion of unspecified shoulder, initial encounter: Secondary | ICD-10-CM | POA: Insufficient documentation

## 2014-04-14 DIAGNOSIS — IMO0002 Reserved for concepts with insufficient information to code with codable children: Secondary | ICD-10-CM | POA: Insufficient documentation

## 2014-04-14 DIAGNOSIS — W208XXA Other cause of strike by thrown, projected or falling object, initial encounter: Secondary | ICD-10-CM | POA: Insufficient documentation

## 2014-04-14 DIAGNOSIS — Y9389 Activity, other specified: Secondary | ICD-10-CM | POA: Insufficient documentation

## 2014-04-14 MED ORDER — OXYCODONE-ACETAMINOPHEN 5-325 MG PO TABS: 1.0000 | ORAL_TABLET | ORAL | Status: AC | PRN

## 2014-04-14 MED ORDER — IBUPROFEN 800 MG PO TABS: 800.0000 mg | ORAL_TABLET | Freq: Three times a day (TID) | ORAL | Status: DC

## 2014-04-14 NOTE — ED Notes (Addendum)
Pt reports falling yesterday while doing yard work. Pt reports falling out of a tree, however he states he caught himself on the way down. Pt reports right shoulder to hand pain after the injury. Multiple superficial abrasions noted to the extremity. Pt is A/O x4, in NAD, and vitals are WDL.

## 2014-04-14 NOTE — Discharge Instructions (Signed)
Call for a follow up appointment with a Family or Primary Care Provider.  Call an orthopedic specialist for further evaluation of your shoulder, elbow, wrist and hand pain. Where your sling as needed. Keep your cuts clean and dry. Return if Symptoms worsen.   Take medication as prescribed.  Ice your shoulder, elbow, wrist 3-4 times a day, and elevate the arm above your heart to decrease swelling.  Use the resource guide listed below to help you find a primary care provider. Use your pain medication as prescribed and do not operate heavy machinery while on pain medication. Note that your pain medication contains acetaminophen (Tylenol) & its is not recommended that you use additional acetaminophen (Tylenol) while taking this medication. Read the instructions below.  Emergency Department Resource Guide 1) Find a Doctor and Pay Out of Pocket Although you won't have to find out who is covered by your insurance plan, it is a good idea to ask around and get recommendations. You will then need to call the office and see if the doctor you have chosen will accept you as a new patient and what types of options they offer for patients who are self-pay. Some doctors offer discounts or will set up payment plans for their patients who do not have insurance, but you will need to ask so you aren't surprised when you get to your appointment.  2) Contact Your Local Health Department Not all health departments have doctors that can see patients for sick visits, but many do, so it is worth a call to see if yours does. If you don't know where your local health department is, you can check in your phone book. The CDC also has a tool to help you locate your state's health department, and many state websites also have listings of all of their local health departments.  3) Find a Walk-in Clinic If your illness is not likely to be very severe or complicated, you may want to try a walk in clinic. These are popping up all over  the country in pharmacies, drugstores, and shopping centers. They're usually staffed by nurse practitioners or physician assistants that have been trained to treat common illnesses and complaints. They're usually fairly quick and inexpensive. However, if you have serious medical issues or chronic medical problems, these are probably not your best option.  No Primary Care Doctor: - Call Health Connect at  [phone] - they can help you locate a primary care doctor that  accepts your insurance, provides certain services, etc. - Physician Referral Service- [phone]  Chronic Pain Problems: Organization         Address  Phone   Notes  [facility]  [phone] Patients need to be referred by their primary care doctor.   Medication Assistance: Organization         Address  Phone   Notes  [place] [facility] [address] [place], [place] [address] [phone] --Must be a resident of [facility] [place] -- Must have NO insurance coverage whatsoever (no Medicaid/ Medicare, etc.) -- The pt. MUST have a primary care doctor that directs their care regularly and follows them in the community   MedAssist  [phone]) [phone]   [employer]  [phone]) [phone]    Agencies that provide inexpensive medical care: Organization         Address  Phone   Notes  [facility]  [phone]) [phone]   [facility]    (  [phone]   [facility] [address] [place], [place] [address] [phone]   [facility] [address], [place] [phone]   Planned Parenthood    [phone]   [facility]    [phone]   [facility]  [address], [place] Phone:  [phone], Fax:  [phone] Hours of Operation:  9 am - 6 pm, M-F.  Also accepts Medicaid/Medicare and self-pay.  [facility]  [address]  [address], [place] Phone: [phone], Fax: [phone]. Hours of Operation:  8:30 am - 5:30 pm, M-F.  Also accepts Medicaid and self-pay.  [facility] [place] [address] [place] Phone: [phone]   [facility] [address] Natasha Bence [place], [place] [phone], Ext. [phone] Mondays & Thursdays: 7-9 AM.  First 15 patients are seen on a first come, first serve basis.    Medicaid-accepting [facility] Providers:  Organization         Address  Phone   Notes  [facility] [address], [place] [phone] Also accepts self-pay patients.  [facility] [address] Laurell Josephs [place], [place]  [phone]   [facility] [address], [place] [phone]   [facility] [address] [place] [phone]   Renaye Rakers [address], [place]   [phone] Only accepts [place] Access [place] patients after they have their name applied to their card.   Self-Pay (no insurance) in [facility]:  Organization         Address  Phone   Notes  Sickle Cell Patients, [facility] [address] [place], [place] [phone]   [facility] [address] [place], [place] [phone]   [facility]  [address], [place] [phone]   [facility]/Dr. Osei-Bonsu  [address], [place] or [address], [place] [phone] Phone number for both [place] and [place] locations is the same.  [facility] [address], [place] [phone]   [facility] [address], [place] or [address] [phone]   [facility] [address], [address] [phone], phone; [phone], fax Sees patients 1st and 3rd Saturday of every month.   Must not qualify for public or private insurance (i.e. Medicaid, Medicare, [place] Health Choice, Veterans' Benefits)  Household income should be no more than 200% of the poverty level The clinic cannot treat you if you are pregnant or think you are pregnant  Sexually transmitted diseases are not treated at the clinic.    Dental Care: Organization         Address  Phone  Notes  [facility] Department of [facility] [address] [place], [place] [phone] Accepts children up to age 73 who are enrolled in [place] or [place] Health Choice; pregnant women with a Medicaid card; and children who have applied for Medicaid or [place] Health Choice, but were declined, whose parents can pay a reduced fee at time of service.  [facility] of [facility]  [address], [place] [phone] Accepts children up  to age 27 who are enrolled in Medicaid or [place] Health Choice; pregnant women with a Medicaid card; and children who have applied for Medicaid or [place] Health Choice, but were declined, whose parents can pay a reduced fee at time of service.  [facility]  [address], [place] [phone] Patients are seen by appointment only. Walk-ins are not accepted. [facility] will see patients 27 years of age and older. Monday - Tuesday (8am-5pm) Most Wednesdays (8:30-5pm) $30 per visit, cash only  [facility]  [address], [facility] [place] [phone] Patients are seen by appointment only. Walk-ins are not accepted. [facility] will see patients 27 years of age and older. One Wednesday Evening (Monthly: Volunteer Based).  $30 per visit, cash only  [employer] [facility] [facility]  [phone] for adults; Children under age 464, call Graduate Pediatric Dentistry at [phone]. Children aged 304-14, please call [phone] to request a pediatric application.  Dental services are  provided in all areas of dental care including fillings, crowns and bridges, complete and partial dentures, implants, gum treatment, root canals, and extractions. Preventive care is also provided. Treatment is provided to both adults and children. Patients are selected via a lottery and there is often a waiting list.   [facility] [address], [place]  [phone]) [phone] [email or web address]   [facility] [address], [place], [place] [phone], Ext. [phone] Second and Fourth Thursday of each month, opens at 6:30 AM; Clinic ends at 9 AM.  Patients are seen on a first-come first-served basis, and a limited number are seen during each clinic.   [facility]  [address], [place], [place] [phone]) [phone]   Eligibility Requirements You must have lived in [place], [place], or [place] counties for at least the last three months.   You cannot be eligible for state or federal sponsored National Cityhealthcare insurance, including CIGNAVeterans Administration, [place]icaid, or [employer]icare.   You generally cannot be eligible for healthcare insurance through your employer.    How to apply: Eligibility screenings are held every Tuesday and Wednesday afternoon from 1:00 pm until 4:00 pm. You do not need an appointment for the interview!  [facility] [address], [place], [place] [phone]   [facility]  [phone]   [facility]  [phone]   [facility]  [phone]    Behavioral Health Resources in the Community: Intensive Outpatient Programs Organization         Address  Phone  Notes  [facility] [address], [place], [place] [phone]   [facility] [address], [place], [place] [phone]   [facility] [address], [place], [place]  [phone]   [facility] [address],  [place], [place]  [phone] or [phone]   Substance Abuse Resources Organization         Address  Phone  Notes  [facility]  [phone]   [facility]  [phone]   [facility]  [phone]   [facility]  [phone]   Residential & Outpatient Substance Abuse Program  [phone]   Psychological Services Organization         Address  Phone  Notes  [facility]  [phone]- [phone]   [facility]  [phone]- [phone]   [facility]  [address] [place] [phone] or [phone]    Mobile Crisis Teams Organization         Address  Phone  Notes  [facility], Mobile Crisis Care Unit  [phone]   [facility]  [address] [place], [place] [phone]   Doristine Locks [address] [place] [phone]    Self-Help/Support Groups Organization         Address  Phone             Notes  Mental Health Assoc. of  - variety of support groups  336- I7437963 Call for more information  Narcotics Anonymous (NA), Caring [facility] [address], [employer] [place]  2 meetings at this location   Statistician         Address  Phone  Notes  [facility] [address],    [place]  [phone]   [facility]  [address] [place] [address], [place], [place] [phone]   [facility] [address] [place], [place] [phone] Admissions: 8am-3pm M-F  [facility] [address],    [place], [place] [phone]   [facility] [address] [place], [place], [place] [phone]   [facility] [address],  [place], [place] [phone]   [facility] - Intensive Outpatient [address]ure[address], [place], [place] [phone]   [facility] (Addiction Recovery [facility] [address] [place].,  [place], [place] [phone] or  [phone]   [facility] ([facility]) [address], [place], [place] [phone] Accepts Medicaid  [facility] [address]  [place] [phone] Substance Abuse/Addiction Treatment   [facility] Organization         Address  Phone  Notes  [facility]  [phone]   Angie Fava, PhD [address] Ervin Knack [place], [place]   [phone] or [phone]   [facility] Behavioral   [address] [place], [place] [phone]   [facility] [address], [place], [place] [phone] Insurance/Medicaid/sponsorship through [facility] and Families [address]                                    [place], [place] [phone] Therapy/tele-psych/case  [facility] [address], [place] [phone]    Dr. Lolly Mustache  [phone]   [facility]  [employer] [facility] Dept. 1) [address], [place] 2) [address] [place] 3)  [address], [place] [phone]  [phone]   [facility] Child Abuse Hotline [phone] or [phone] (After Hours)

## 2014-04-14 NOTE — ED Provider Notes (Signed)
CSN: [id number]     Arrival date & time 04/14/14  1217 History  This chart was scribed for non-physician practitioner, Clabe SealLauren M Colsen Modi, PA-C, working with Gilda Creasehristopher J. Pollina, MD by Charline BillsEssence Howell, ED Scribe. This patient was seen in room WTR8/WTR8 and the patient's care was started at 12:43 PM.   Chief Complaint  Patient presents with  . Arm Injury   HPI Comments: Bryan Walsh is a 28 y.o. male who presents to the Emergency Department complaining of fall that occurred yesterday. Pt reports falling out of a tree while doing yard work. He staets that the tree limb fell on top of him and he attempted to catch himself while falling. He reports associated R shoulder, R elbow and R hand pain. Pt denies LOC or hitting his head. He has not taken anything for pain. No known allergies. Last tetanus was 2011.  The history is provided by the patient. No language interpreter was used.   Past Medical History  Diagnosis Date  . No significant past medical history    Past Surgical History  Procedure Laterality Date  . Mouth surgery     No family history on file. History  Substance Use Topics  . Smoking status: Never Smoker   . Smokeless tobacco: Never Used  . Alcohol Use: No    Review of Systems  Musculoskeletal: Positive for arthralgias, joint swelling and myalgias. Negative for back pain and neck pain.  Skin: Positive for wound.  Neurological: Negative for syncope and numbness.  All other systems reviewed and are negative.   Allergies  Review of patient's allergies indicates no known allergies.  Home Medications   Prior to Admission medications   Medication Sig Start Date End Date Taking? Authorizing Provider  sulfamethoxazole-trimethoprim (SEPTRA DS) 800-160 MG per tablet Take 2 tabs twice a day for 2 days, then take 1 tab twice a day after that 01/14/14   Gilda Creasehristopher J. Pollina, MD  traMADol (ULTRAM) 50 MG tablet Take 1 tablet (50 mg total) by mouth every 6 (six) hours as needed.  01/14/14   Gilda Creasehristopher J. Pollina, MD   Triage Vitals: BP 109/64  Pulse 94  Temp(Src) 98.8 F (37.1 C) (Oral)  Resp 20  SpO2 99% Physical Exam  Nursing note and vitals reviewed. Constitutional: He is oriented to person, place, and time. He appears well-developed and well-nourished. He is cooperative.  Non-toxic appearance. He does not have a sickly appearance. He does not appear ill. No distress.  HENT:  Head: Normocephalic and atraumatic.  Eyes: EOM are normal. Pupils are equal, round, and reactive to light.  Neck: Normal range of motion. Neck supple.  Pulmonary/Chest: Effort normal.  Musculoskeletal:       Right shoulder: He exhibits decreased range of motion and tenderness. He exhibits no deformity.       Right elbow: He exhibits swelling. He exhibits no deformity. Tenderness found. Lateral epicondyle tenderness noted. No medial epicondyle and no olecranon process tenderness noted.       Arms:      Right hand: He exhibits decreased range of motion and tenderness. He exhibits normal capillary refill.       Hands: Right shoulder: Decrease active ROM Right hand: Decrease in active ROM, moderate associated swelling.  Multiple superficial abrasions to finger, dorsal hand and palm. No anatomical snuffbox tenderness with palpation.  Neurological: He is alert and oriented to person, place, and time.  Skin: Skin is warm and dry. He is not diaphoretic.  Psychiatric: He has a  normal mood and affect. His behavior is normal.   ED Course  Procedures (including critical care time)  COORDINATION OF CARE: 12:48 PM-Discussed treatment plan with pt at bedside and pt agreed to plan.   Labs Review Labs Reviewed - No data to display  Imaging Review Dg Shoulder Right  04/14/2014   CLINICAL DATA:  Fall. Posterior elbow pain and and right shoulder pain.  EXAM: RIGHT SHOULDER - 2+ VIEW  COMPARISON:  Right elbow radiographs and right wrist radiographs 04/14/2014  FINDINGS: There is no evidence of  fracture or dislocation. There is no evidence of arthropathy or other focal bone abnormality. Soft tissues are unremarkable.  IMPRESSION: Negative.   Electronically Signed   By: Britta MccreedySusan  Turner M.D.   On: 04/14/2014 13:33   Dg Elbow Complete Right  04/14/2014   CLINICAL DATA:  Abrasions around the wrist and elbow, status post fall out of a tree.  EXAM: RIGHT ELBOW - COMPLETE 3+ VIEW  COMPARISON:  None.  FINDINGS: There is no evidence of fracture, dislocation, or joint effusion. There is no evidence of arthropathy or other focal bone abnormality. Soft tissues are unremarkable. There is no radiopaque foreign body.  IMPRESSION: Negative.   Electronically Signed   By: Sherian ReinWei-Chen  Lin M.D.   On: 04/14/2014 13:41   Dg Wrist Complete Right  04/14/2014   CLINICAL DATA:  Abrasions around the wrist and elbow status post fall from tree  EXAM: RIGHT WRIST - COMPLETE 3+ VIEW  COMPARISON:  None.  FINDINGS: There is no evidence of fracture or dislocation. There is no evidence of arthropathy or other focal bone abnormality. Soft tissues are unremarkable.  IMPRESSION: Negative.   Electronically Signed   By: Sherian ReinWei-Chen  Lin M.D.   On: 04/14/2014 13:44    MDM   Final diagnoses:  Contusion of right shoulder  Contusion of right elbow  Contusion of right wrist  Abrasion of arm, right   Patient presents with right elbow, shoulder, hand pain after a fall. Hand shows multiple abrasions and associated swelling. X-ray: Shoulder, wrist, elbow negative for fracture.  Will place in a splint and a arm sling and have patient followup with or so. Rice encouraged patient is up-to-date on tetanus. Discussed imaging results, and treatment plan with the patient. Return precautions given. Reports understanding and no other concerns at this time.  Patient is stable for discharge at this time.  Meds given in ED:  Medications - No data to display  New Prescriptions   IBUPROFEN (ADVIL,MOTRIN) 800 MG TABLET    Take 1 tablet (800 mg total)  by mouth 3 (three) times daily.   OXYCODONE-ACETAMINOPHEN (PERCOCET/ROXICET) 5-325 MG PER TABLET    Take 1 tablet by mouth every 4 (four) hours as needed for severe pain.   I personally performed the services described in this documentation, which was scribed in my presence. The recorded information has been reviewed and is accurate.    Clabe SealLauren M Dewel Lotter, PA-C 04/14/14 1935

## 2014-04-15 NOTE — ED Provider Notes (Signed)
Medical screening examination/treatment/procedure(s) were performed by non-physician practitioner and as supervising physician I was immediately available for consultation/collaboration.   EKG Interpretation None        Harris Kistler J. Addison Freimuth, MD 04/15/14 0820 

## 2014-12-09 ENCOUNTER — Encounter (HOSPITAL_COMMUNITY): Payer: Self-pay | Admitting: Nurse Practitioner

## 2014-12-09 ENCOUNTER — Emergency Department (HOSPITAL_COMMUNITY)
Admission: EM | Admit: 2014-12-09 | Discharge: 2014-12-10 | Disposition: A | Discharge status: Home / Self Care | Payer: Self-pay | Attending: Emergency Medicine | Admitting: Emergency Medicine

## 2014-12-09 DIAGNOSIS — Y998 Other external cause status: Secondary | ICD-10-CM | POA: Insufficient documentation

## 2014-12-09 DIAGNOSIS — S161XXA Strain of muscle, fascia and tendon at neck level, initial encounter: Secondary | ICD-10-CM | POA: Insufficient documentation

## 2014-12-09 DIAGNOSIS — Y92410 Unspecified street and highway as the place of occurrence of the external cause: Secondary | ICD-10-CM | POA: Insufficient documentation

## 2014-12-09 DIAGNOSIS — Z791 Long term (current) use of non-steroidal anti-inflammatories (NSAID): Secondary | ICD-10-CM | POA: Insufficient documentation

## 2014-12-09 DIAGNOSIS — Y9389 Activity, other specified: Secondary | ICD-10-CM | POA: Insufficient documentation
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
## 2014-12-10 MED ORDER — HYDROCODONE-ACETAMINOPHEN 5-325 MG PO TABS: 1.0000 | ORAL_TABLET | ORAL | Status: AC | PRN

## 2014-12-10 MED ORDER — IBUPROFEN 800 MG PO TABS: 800.0000 mg | ORAL_TABLET | Freq: Three times a day (TID) | ORAL | Status: DC

## 2014-12-10 MED ORDER — HYDROCODONE-ACETAMINOPHEN 5-325 MG PO TABS
1.0000 | ORAL_TABLET | Freq: Once | ORAL | Status: AC
  Administered 2014-12-10: 1 via ORAL
  Filled 2014-12-10: qty 1

## 2014-12-10 NOTE — Discharge Instructions (Signed)
Cervical Sprain °A cervical sprain is an injury in the neck in which the strong, fibrous tissues (ligaments) that connect your neck bones stretch or tear. Cervical sprains can range from mild to severe. Severe cervical sprains can cause the neck vertebrae to be unstable. This can lead to damage of the spinal cord and can result in serious nervous system problems. The amount of time it takes for a cervical sprain to get better depends on the cause and extent of the injury. Most cervical sprains heal in 1 to 3 weeks. °CAUSES  °Severe cervical sprains may be caused by:  °· Contact sport injuries (such as from football, rugby, wrestling, hockey, auto racing, gymnastics, diving, martial arts, or boxing).   °· Motor vehicle collisions.   °· Whiplash injuries. This is an injury from a sudden forward and backward whipping movement of the head and neck.  °· Falls.   °Mild cervical sprains may be caused by:  °· Being in an awkward position, such as while cradling a telephone between your ear and shoulder.   °· Sitting in a chair that does not offer proper support.   °· Working at a poorly designed computer station.   °· Looking up or down for long periods of time.   °SYMPTOMS  °· Pain, soreness, stiffness, or a burning sensation in the front, back, or sides of the neck. This discomfort may develop immediately after the injury or slowly, 24 hours or more after the injury.   °· Pain or tenderness directly in the middle of the back of the neck.   °· Shoulder or upper back pain.   °· Limited ability to move the neck.   °· Headache.   °· Dizziness.   °· Weakness, numbness, or tingling in the hands or arms.   °· Muscle spasms.   °· Difficulty swallowing or chewing.   °· Tenderness and swelling of the neck.   °DIAGNOSIS  °Most of the time your health care provider can diagnose a cervical sprain by taking your history and doing a physical exam. Your health care provider will ask about previous neck injuries and any known neck  problems, such as arthritis in the neck. X-rays may be taken to find out if there are any other problems, such as with the bones of the neck. Other tests, such as a CT scan or MRI, may also be needed.  °TREATMENT  °Treatment depends on the severity of the cervical sprain. Mild sprains can be treated with rest, keeping the neck in place (immobilization), and pain medicines. Severe cervical sprains are immediately immobilized. Further treatment is done to help with pain, muscle spasms, and other symptoms and may include: °· Medicines, such as pain relievers, numbing medicines, or muscle relaxants.   °· Physical therapy. This may involve stretching exercises, strengthening exercises, and posture training. Exercises and improved posture can help stabilize the neck, strengthen muscles, and help stop symptoms from returning.   °HOME CARE INSTRUCTIONS  °· Put ice on the injured area.   °· Put ice in a plastic bag.   °· Place a towel between your skin and the bag.   °· Leave the ice on for 15-20 minutes, 3-4 times a day.   °· If your injury was severe, you may have been given a cervical collar to wear. A cervical collar is a two-piece collar designed to keep your neck from moving while it heals. °· Do not remove the collar unless instructed by your health care provider. °· If you have long hair, keep it outside of the collar. °· Ask your health care provider before making any adjustments to your collar. Minor   adjustments may be required over time to improve comfort and reduce pressure on your chin or on the back of your head. °· If you are allowed to remove the collar for cleaning or bathing, follow your health care provider's instructions on how to do so safely. °· Keep your collar clean by wiping it with mild soap and water and drying it completely. If the collar you have been given includes removable pads, remove them every 1-2 days and hand wash them with soap and water. Allow them to air dry. They should be completely  dry before you wear them in the collar. °· If you are allowed to remove the collar for cleaning and bathing, wash and dry the skin of your neck. Check your skin for irritation or sores. If you see any, tell your health care provider. °· Do not drive while wearing the collar.   °· Only take over-the-counter or prescription medicines for pain, discomfort, or fever as directed by your health care provider.   °· Keep all follow-up appointments as directed by your health care provider.   °· Keep all physical therapy appointments as directed by your health care provider.   °· Make any needed adjustments to your workstation to promote good posture.   °· Avoid positions and activities that make your symptoms worse.   °· Warm up and stretch before being active to help prevent problems.   °SEEK MEDICAL CARE IF:  °· Your pain is not controlled with medicine.   °· You are unable to decrease your pain medicine over time as planned.   °· Your activity level is not improving as expected.   °SEEK IMMEDIATE MEDICAL CARE IF:  °· You develop any bleeding. °· You develop stomach upset. °· You have signs of an allergic reaction to your medicine.   °· Your symptoms get worse.   °· You develop new, unexplained symptoms.   °· You have numbness, tingling, weakness, or paralysis in any part of your body.   °MAKE SURE YOU:  °· Understand these instructions. °· Will watch your condition. °· Will get help right away if you are not doing well or get worse. °Document Released: 09/09/2007 Document Revised: 11/17/2013 Document Reviewed: 05/20/2013 °ExitCare® Patient Information ©2015 [employer]. This information is not intended to replace advice given to you by your health care provider. Make sure you discuss any questions you have with your health care provider. ° °Cryotherapy °Cryotherapy means treatment with cold. Ice or gel packs can be used to reduce both pain and swelling. Ice is the most helpful within the first 24 to 48 hours after an  injury or flare-up from overusing a muscle or joint. Sprains, strains, spasms, burning pain, shooting pain, and aches can all be eased with ice. Ice can also be used when recovering from surgery. Ice is effective, has very few side effects, and is safe for most people to use. °PRECAUTIONS  °Ice is not a safe treatment option for people with: °· Raynaud phenomenon. This is a condition affecting small blood vessels in the extremities. Exposure to cold may cause your problems to return. °· Cold hypersensitivity. There are many forms of cold hypersensitivity, including: °¨ Cold urticaria. Red, itchy hives appear on the skin when the tissues begin to warm after being iced. °¨ Cold erythema. This is a red, itchy rash caused by exposure to cold. °¨ Cold hemoglobinuria. Red blood cells break down when the tissues begin to warm after being iced. The hemoglobin that carry oxygen are passed into the urine because they cannot combine with blood proteins fast enough. °·   Numbness or altered sensitivity in the area being iced. °If you have any of the following conditions, do not use ice until you have discussed cryotherapy with your caregiver: °· Heart conditions, such as arrhythmia, angina, or chronic heart disease. °· High blood pressure. °· Healing wounds or open skin in the area being iced. °· Current infections. °· Rheumatoid arthritis. °· Poor circulation. °· Diabetes. °Ice slows the blood flow in the region it is applied. This is beneficial when trying to stop inflamed tissues from spreading irritating chemicals to surrounding tissues. However, if you expose your skin to cold temperatures for too long or without the proper protection, you can damage your skin or nerves. Watch for signs of skin damage due to cold. °HOME CARE INSTRUCTIONS °Follow these tips to use ice and cold packs safely. °· Place a dry or damp towel between the ice and skin. A damp towel will cool the skin more quickly, so you may need to shorten the time  that the ice is used. °· For a more rapid response, add gentle compression to the ice. °· Ice for no more than 10 to 20 minutes at a time. The bonier the area you are icing, the less time it will take to get the benefits of ice. °· Check your skin after 5 minutes to make sure there are no signs of a poor response to cold or skin damage. °· Rest 20 minutes or more between uses. °· Once your skin is numb, you can end your treatment. You can test numbness by very lightly touching your skin. The touch should be so light that you do not see the skin dimple from the pressure of your fingertip. When using ice, most people will feel these normal sensations in this order: cold, burning, aching, and numbness. °· Do not use ice on someone who cannot communicate their responses to pain, such as small children or people with dementia. °HOW TO MAKE AN ICE PACK °Ice packs are the most common way to use ice therapy. Other methods include ice massage, ice baths, and cryosprays. Muscle creams that cause a cold, tingly feeling do not offer the same benefits that ice offers and should not be used as a substitute unless recommended by your caregiver. °To make an ice pack, do one of the following: °· Place crushed ice or a bag of frozen vegetables in a sealable plastic bag. Squeeze out the excess air. Place this bag inside another plastic bag. Slide the bag into a pillowcase or place a damp towel between your skin and the bag. °· Mix 3 parts water with 1 part rubbing alcohol. Freeze the mixture in a sealable plastic bag. When you remove the mixture from the freezer, it will be slushy. Squeeze out the excess air. Place this bag inside another plastic bag. Slide the bag into a pillowcase or place a damp towel between your skin and the bag. °SEEK MEDICAL CARE IF: °· You develop white spots on your skin. This may give the skin a blotchy (mottled) appearance. °· Your skin turns blue or pale. °· Your skin becomes waxy or hard. °· Your swelling  gets worse. °MAKE SURE YOU:  °· Understand these instructions. °· Will watch your condition. °· Will get help right away if you are not doing well or get worse. °Document Released: 07/09/2011 Document Revised: 03/29/2014 Document Reviewed: 07/09/2011 °ExitCare® Patient Information ©2015 [employer]. This information is not intended to replace advice given to you by your health care provider. Make   sure you discuss any questions you have with your health care provider. °Motor Vehicle Collision °It is common to have multiple bruises and sore muscles after a motor vehicle collision (MVC). These tend to feel worse for the first 24 hours. You may have the most stiffness and soreness over the first several hours. You may also feel worse when you wake up the first morning after your collision. After this point, you will usually begin to improve with each day. The speed of improvement often depends on the severity of the collision, the number of injuries, and the location and nature of these injuries. °HOME CARE INSTRUCTIONS °· Put ice on the injured area. °¨ Put ice in a plastic bag. °¨ Place a towel between your skin and the bag. °¨ Leave the ice on for 15-20 minutes, 3-4 times a day, or as directed by your health care provider. °· Drink enough fluids to keep your urine clear or pale yellow. Do not drink alcohol. °· Take a warm shower or bath once or twice a day. This will increase blood flow to sore muscles. °· You may return to activities as directed by your caregiver. Be careful when lifting, as this may aggravate neck or back pain. °· Only take over-the-counter or prescription medicines for pain, discomfort, or fever as directed by your caregiver. Do not use aspirin. This may increase bruising and bleeding. °SEEK IMMEDIATE MEDICAL CARE IF: °· You have numbness, tingling, or weakness in the arms or legs. °· You develop severe headaches not relieved with medicine. °· You have severe neck pain, especially tenderness in  the middle of the back of your neck. °· You have changes in bowel or bladder control. °· There is increasing pain in any area of the body. °· You have shortness of breath, light-headedness, dizziness, or fainting. °· You have chest pain. °· You feel sick to your stomach (nauseous), throw up (vomit), or sweat. °· You have increasing abdominal discomfort. °· There is blood in your urine, stool, or vomit. °· You have pain in your shoulder (shoulder strap areas). °· You feel your symptoms are getting worse. °MAKE SURE YOU: °· Understand these instructions. °· Will watch your condition. °· Will get help right away if you are not doing well or get worse. °Document Released: 11/12/2005 Document Revised: 03/29/2014 Document Reviewed: 04/11/2011 °ExitCare® Patient Information ©2015 [employer]. This information is not intended to replace advice given to you by your health care provider. Make sure you discuss any questions you have with your health care provider. ° °
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
## 2015-03-18 ENCOUNTER — Encounter (HOSPITAL_COMMUNITY): Payer: Self-pay

## 2015-03-18 ENCOUNTER — Emergency Department (HOSPITAL_COMMUNITY): Payer: Self-pay

## 2015-03-18 ENCOUNTER — Emergency Department (HOSPITAL_COMMUNITY)
Admission: EM | Admit: 2015-03-18 | Discharge: 2015-03-18 | Disposition: A | Discharge status: Home / Self Care | Payer: Self-pay | Attending: Emergency Medicine | Admitting: Emergency Medicine

## 2015-03-18 DIAGNOSIS — M25532 Pain in left wrist: Secondary | ICD-10-CM

## 2015-03-18 DIAGNOSIS — Y998 Other external cause status: Secondary | ICD-10-CM | POA: Insufficient documentation

## 2015-03-18 DIAGNOSIS — Z791 Long term (current) use of non-steroidal anti-inflammatories (NSAID): Secondary | ICD-10-CM | POA: Insufficient documentation

## 2015-03-18 DIAGNOSIS — S6991XA Unspecified injury of right wrist, hand and finger(s), initial encounter: Secondary | ICD-10-CM | POA: Insufficient documentation

## 2015-03-18 DIAGNOSIS — S6992XA Unspecified injury of left wrist, hand and finger(s), initial encounter: Secondary | ICD-10-CM | POA: Insufficient documentation

## 2015-03-18 DIAGNOSIS — Y9389 Activity, other specified: Secondary | ICD-10-CM | POA: Insufficient documentation

## 2015-03-18 DIAGNOSIS — M25531 Pain in right wrist: Secondary | ICD-10-CM

## 2015-03-18 DIAGNOSIS — S199XXA Unspecified injury of neck, initial encounter: Secondary | ICD-10-CM | POA: Insufficient documentation

## 2015-03-18 DIAGNOSIS — Y9289 Other specified places as the place of occurrence of the external cause: Secondary | ICD-10-CM | POA: Insufficient documentation

## 2015-03-18 DIAGNOSIS — M79641 Pain in right hand: Secondary | ICD-10-CM

## 2015-03-18 MED ORDER — IBUPROFEN 800 MG PO TABS: 800.0000 mg | ORAL_TABLET | Freq: Three times a day (TID) | ORAL | Status: DC | PRN

## 2015-03-18 NOTE — Discharge Instructions (Signed)
Read the information below.  Use the prescribed medication as directed.  Please discuss all new medications with your pharmacist.  You may return to the Emergency Department at any time for worsening condition or any new symptoms that concern you.    If you develop uncontrolled pain, weakness or numbness of the extremity, severe discoloration of the skin, or you are unable to use your hands or wrists, return to the ER for a recheck.      Musculoskeletal Pain Musculoskeletal pain is muscle and boney aches and pains. These pains can occur in any part of the body. Your caregiver may treat you without knowing the cause of the pain. They may treat you if blood or urine tests, X-rays, and other tests were normal.  CAUSES There is often not a definite cause or reason for these pains. These pains may be caused by a type of germ (virus). The discomfort may also come from overuse. Overuse includes working out too hard when your body is not fit. Boney aches also come from weather changes. Bone is sensitive to atmospheric pressure changes. HOME CARE INSTRUCTIONS   Ask when your test results will be ready. Make sure you get your test results.  Only take over-the-counter or prescription medicines for pain, discomfort, or fever as directed by your caregiver. If you were given medications for your condition, do not drive, operate machinery or power tools, or sign legal documents for 24 hours. Do not drink alcohol. Do not take sleeping pills or other medications that may interfere with treatment.  Continue all activities unless the activities cause more pain. When the pain lessens, slowly resume normal activities. Gradually increase the intensity and duration of the activities or exercise.  During periods of severe pain, bed rest may be helpful. Lay or sit in any position that is comfortable.  Putting ice on the injured area.  Put ice in a bag.  Place a towel between your skin and the bag.  Leave the ice on  for 15 to 20 minutes, 3 to 4 times a day.  Follow up with your caregiver for continued problems and no reason can be found for the pain. If the pain becomes worse or does not go away, it may be necessary to repeat tests or do additional testing. Your caregiver may need to look further for a possible cause. SEEK IMMEDIATE MEDICAL CARE IF:  You have pain that is getting worse and is not relieved by medications.  You develop chest pain that is associated with shortness or breath, sweating, feeling sick to your stomach (nauseous), or throw up (vomit).  Your pain becomes localized to the abdomen.  You develop any new symptoms that seem different or that concern you. MAKE SURE YOU:   Understand these instructions.  Will watch your condition.  Will get help right away if you are not doing well or get worse. Document Released: 11/12/2005 Document Revised: 02/04/2012 Document Reviewed: 07/17/2013 [facility] Patient Information 2015 [place]Care, [place]. This information is not intended to replace advice given to you by your health care provider. Make sure you discuss any questions you have with your health care provider.

## 2015-03-18 NOTE — ED Notes (Signed)
Ace wrap applied to right wrist. AVS explained in detail. Knows to follow up with [facility] for further management and PCP set up. No other c/c.
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
## 2015-10-10 ENCOUNTER — Encounter (HOSPITAL_COMMUNITY): Payer: Self-pay | Admitting: Emergency Medicine

## 2015-10-10 ENCOUNTER — Emergency Department (HOSPITAL_COMMUNITY)
Admission: EM | Admit: 2015-10-10 | Discharge: 2015-10-10 | Disposition: A | Discharge status: Home / Self Care | Payer: Self-pay | Attending: Emergency Medicine | Admitting: Emergency Medicine

## 2015-10-10 DIAGNOSIS — Y9389 Activity, other specified: Secondary | ICD-10-CM | POA: Insufficient documentation

## 2015-10-10 DIAGNOSIS — Y92410 Unspecified street and highway as the place of occurrence of the external cause: Secondary | ICD-10-CM | POA: Insufficient documentation

## 2015-10-10 DIAGNOSIS — M542 Cervicalgia: Secondary | ICD-10-CM

## 2015-10-10 DIAGNOSIS — S199XXA Unspecified injury of neck, initial encounter: Secondary | ICD-10-CM | POA: Insufficient documentation

## 2015-10-10 DIAGNOSIS — Y998 Other external cause status: Secondary | ICD-10-CM | POA: Insufficient documentation

## 2015-10-10 MED ORDER — IBUPROFEN 600 MG PO TABS: 600.0000 mg | ORAL_TABLET | Freq: Four times a day (QID) | ORAL | Status: DC | PRN

## 2015-10-10 MED ORDER — CYCLOBENZAPRINE HCL 10 MG PO TABS: 10.0000 mg | ORAL_TABLET | Freq: Two times a day (BID) | ORAL | Status: DC | PRN
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
## 2015-10-10 NOTE — Discharge Instructions (Signed)
Please read attached information. If you experience any new or worsening signs or symptoms please return to the emergency room for evaluation. Please follow-up with your primary care provider or specialist as discussed. Please use medication prescribed only as directed and discontinue taking if you have any concerning signs or symptoms.   °

## 2015-10-10 NOTE — ED Notes (Signed)
Denies LOC, Dizziness or difficulty walking. Speaking in full sentences.

## 2015-10-10 NOTE — ED Notes (Signed)
Waiting for the prescription to be signed for d/c. Pt no longer in room.
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
## 2016-01-22 ENCOUNTER — Encounter (HOSPITAL_COMMUNITY): Payer: Self-pay

## 2016-01-22 ENCOUNTER — Emergency Department (HOSPITAL_COMMUNITY): Payer: No Typology Code available for payment source

## 2016-01-22 ENCOUNTER — Emergency Department (HOSPITAL_COMMUNITY)
Admission: EM | Admit: 2016-01-22 | Discharge: 2016-01-22 | Disposition: A | Discharge status: Home / Self Care | Payer: No Typology Code available for payment source | Attending: Emergency Medicine | Admitting: Emergency Medicine

## 2016-01-22 DIAGNOSIS — S9031XA Contusion of right foot, initial encounter: Secondary | ICD-10-CM

## 2016-01-22 DIAGNOSIS — W208XXA Other cause of strike by thrown, projected or falling object, initial encounter: Secondary | ICD-10-CM | POA: Insufficient documentation

## 2016-01-22 DIAGNOSIS — Y998 Other external cause status: Secondary | ICD-10-CM | POA: Insufficient documentation

## 2016-01-22 DIAGNOSIS — Y9389 Activity, other specified: Secondary | ICD-10-CM | POA: Insufficient documentation

## 2016-01-22 DIAGNOSIS — Y9289 Other specified places as the place of occurrence of the external cause: Secondary | ICD-10-CM | POA: Insufficient documentation

## 2016-01-22 MED ORDER — NAPROXEN 500 MG PO TABS: 500.0000 mg | ORAL_TABLET | Freq: Two times a day (BID) | ORAL | Status: DC

## 2016-01-22 NOTE — Discharge Instructions (Signed)
Foot Contusion A foot contusion is a deep bruise to the foot. Contusions are the result of an injury that caused bleeding under the skin. The contusion may turn blue, purple, or yellow. Minor injuries will give you a painless contusion, but more severe contusions may stay painful and swollen for a few weeks. CAUSES  A foot contusion comes from a direct blow to that area, such as a heavy object falling on the foot. SYMPTOMS   Swelling of the foot.  Discoloration of the foot.  Tenderness or soreness of the foot. DIAGNOSIS  You will have a physical exam and will be asked about your history. You may need an X-ray of your foot to look for a broken bone (fracture).  TREATMENT  An elastic wrap may be recommended to support your foot. Resting, elevating, and applying cold compresses to your foot are often the best treatments for a foot contusion. Over-the-counter medicines may also be recommended for pain control. HOME CARE INSTRUCTIONS   Put ice on the injured area.  Put ice in a plastic bag.  Place a towel between your skin and the bag.  Leave the ice on for 15-20 minutes, 03-04 times a day.  Only take over-the-counter or prescription medicines for pain, discomfort, or fever as directed by your caregiver.  If told, use an elastic wrap as directed. This can help reduce swelling. You may remove the wrap for sleeping, showering, and bathing. If your toes become numb, cold, or blue, take the wrap off and reapply it more loosely.  Elevate your foot with pillows to reduce swelling.  Try to avoid standing or walking while the foot is painful. Do not resume use until instructed by your caregiver. Then, begin use gradually. If pain develops, decrease use. Gradually increase activities that do not cause discomfort until you have normal use of your foot.  See your caregiver as directed. It is very important to keep all follow-up appointments in order to avoid any lasting problems with your foot,  including long-term (chronic) pain. SEEK IMMEDIATE MEDICAL CARE IF:   You have increased redness, swelling, or pain in your foot.  Your swelling or pain is not relieved with medicines.  You have loss of feeling in your foot or are unable to move your toes.  Your foot turns cold or blue.  You have pain when you move your toes.  Your foot becomes warm to the touch.  Your contusion does not improve in 2 days. MAKE SURE YOU:   Understand these instructions.  Will watch your condition.  Will get help right away if you are not doing well or get worse.   This information is not intended to replace advice given to you by your health care provider. Make sure you discuss any questions you have with your health care provider.   Follow-up with orthopedic provider if your symptoms do not improve. Apply ice to affected area. Take naproxen for pain and inflammation. Keep leg elevated. Return to the emergency department a few experience severe worsening of her symptoms, numbness or tingling in your extremity, discoloration of your foot, increased swelling.

## 2016-01-22 NOTE — ED Notes (Signed)
Per pt, states foot pain since last night.  No injury noted.  Pain goes across top and to the side.

## 2016-01-22 NOTE — ED Provider Notes (Signed)
CSN: [id number]     Arrival date & time 01/22/16  1157 History  By signing my name below, I, Linna Darner, attest that this documentation has been prepared under the direction and in the presence of non-physician practitioner, Gaylyn Rong, PA-C. Electronically Signed: Linna Darner, Scribe. 01/22/2016. 12:47 PM.    Chief Complaint  Patient presents with  . Foot Injury    The history is provided by the patient. No language interpreter was used.     HPI Comments: Bryan Walsh is a 30 y.o. male who presents to the Emergency Department complaining of sudden onset, constant, right foot pain since last night. Pt reports that he was dropped some bricks on the middle of his top right foot while working outside last night. Pt notes that he was wearing shoes during the incident. He reports that he has broken his right foot in the past. He states that he was unable to ambulate last night but can ambulate today. He reports associated right foot swelling and states that he is unable to lift his toes up. Pt has not taken any medications for pain. He has not applied ice but notes that he kept his foot elevated last night. He endorses pain with palpation to the top of his right foot. He denies numbness or any other associated symptoms at this time.    Past Medical History  Diagnosis Date  . No significant past medical history    Past Surgical History  Procedure Laterality Date  . Mouth surgery     History reviewed. No pertinent family history. Social History  Substance Use Topics  . Smoking status: Never Smoker   . Smokeless tobacco: Never Used  . Alcohol Use: Yes    Review of Systems  All other systems reviewed and are negative.     Allergies  Review of patient's allergies indicates no known allergies.  Home Medications   Prior to Admission medications   Medication Sig Start Date End Date Taking? Authorizing Provider  cyclobenzaprine (FLEXERIL) 10 MG tablet Take 1 tablet (10  mg total) by mouth 2 (two) times daily as needed for muscle spasms. 10/10/15   Eyvonne Mechanic, PA-C  HYDROcodone-acetaminophen (NORCO/VICODIN) 5-325 MG per tablet Take 1-2 tablets by mouth every 4 (four) hours as needed. 12/10/14   Elpidio Anis, PA-C  ibuprofen (ADVIL,MOTRIN) 600 MG tablet Take 1 tablet (600 mg total) by mouth every 6 (six) hours as needed. 10/10/15   Eyvonne Mechanic, PA-C  ibuprofen (ADVIL,MOTRIN) 800 MG tablet Take 1 tablet (800 mg total) by mouth every 8 (eight) hours as needed for mild pain or moderate pain. 03/18/15   Trixie Dredge, PA-C  oxyCODONE-acetaminophen (PERCOCET/ROXICET) 5-325 MG per tablet Take 1 tablet by mouth every 4 (four) hours as needed for severe pain. 04/14/14   Lauren Parker, PA-C   BP 127/73 mmHg  Pulse 77  Temp(Src) 98.2 F (36.8 C) (Oral)  Resp 18  SpO2 98% Physical Exam  Constitutional: He is oriented to person, place, and time. He appears well-developed and well-nourished. No distress.  HENT:  Head: Normocephalic and atraumatic.  Eyes: Conjunctivae are normal. Right eye exhibits no discharge. Left eye exhibits no discharge. No scleral icterus.  Cardiovascular: Normal rate.   Pulmonary/Chest: Effort normal.  Musculoskeletal:       Right foot: There is tenderness. There is normal range of motion, no swelling and no deformity.  TTP over dorsum of right foot  No edema or ecchymosis No decreased ROM ankle or metatarsals Intact distal pulse  No obvious bony deformity    Neurological: He is alert and oriented to person, place, and time. Coordination normal.  Strength 5/5 throughout. No sensory deficits.    Skin: Skin is warm and dry. No rash noted. He is not diaphoretic. No erythema. No pallor.  Psychiatric: He has a normal mood and affect. His behavior is normal.  Nursing note and vitals reviewed.   ED Course  Procedures (including critical care time)  DIAGNOSTIC STUDIES: Oxygen Saturation is 98% on RA, normal by my interpretation.     COORDINATION OF CARE: 12:47 PM Will order post op shoe. Discussed treatment plan with pt at bedside and pt agreed to plan.   Labs Review Labs Reviewed - No data to display  Imaging Review Dg Foot Complete Right  01/22/2016  CLINICAL DATA:  Foot injury. Dropped breaks on foot last night. Anterior foot pain EXAM: RIGHT FOOT COMPLETE - 3+ VIEW COMPARISON:  05/27/2013 FINDINGS: No acute bony abnormality. Specifically, no fracture, subluxation, or dislocation. Soft tissues are intact. IMPRESSION: No acute bony abnormality. Electronically Signed   By: Charlett Nose M.D.   On: 01/22/2016 12:33   I have personally reviewed and evaluated these images and lab results as part of my medical decision-making.   EKG Interpretation None      MDM   Final diagnoses:  Foot contusion, right, initial encounter   Patient X-Ray negative for obvious fracture or dislocation.  Pt advised to follow up with orthopedics if symptoms do not improve. Patient given post op shoe while in ED, conservative therapy recommended and discussed. Patient will be discharged home & is agreeable with above plan. Returns precautions discussed. Pt appears safe for discharge.   I personally performed the services described in this documentation, which was scribed in my presence. The recorded information has been reviewed and is accurate.      Lester Kinsman [place], PA-C 01/22/16 1254  Geoffery Lyons, MD 01/22/16 [phone]

## 2017-03-21 ENCOUNTER — Emergency Department (HOSPITAL_COMMUNITY)
Admission: EM | Admit: 2017-03-21 | Discharge: 2017-03-21 | Disposition: A | Discharge status: Home / Self Care | Payer: No Typology Code available for payment source | Attending: Emergency Medicine | Admitting: Emergency Medicine

## 2017-03-21 ENCOUNTER — Emergency Department (HOSPITAL_COMMUNITY): Payer: No Typology Code available for payment source

## 2017-03-21 DIAGNOSIS — Y9329 Activity, other involving ice and snow: Secondary | ICD-10-CM | POA: Insufficient documentation

## 2017-03-21 DIAGNOSIS — S3992XA Unspecified injury of lower back, initial encounter: Secondary | ICD-10-CM | POA: Diagnosis present

## 2017-03-21 DIAGNOSIS — M545 Low back pain, unspecified: Secondary | ICD-10-CM

## 2017-03-21 DIAGNOSIS — Y92410 Unspecified street and highway as the place of occurrence of the external cause: Secondary | ICD-10-CM | POA: Diagnosis not present

## 2017-03-21 DIAGNOSIS — Z79899 Other long term (current) drug therapy: Secondary | ICD-10-CM | POA: Diagnosis not present

## 2017-03-21 DIAGNOSIS — Y999 Unspecified external cause status: Secondary | ICD-10-CM | POA: Diagnosis not present

## 2017-03-21 MED ORDER — METHOCARBAMOL 500 MG PO TABS: 500.0000 mg | ORAL_TABLET | Freq: Two times a day (BID) | ORAL | 0 refills | Status: AC | PRN

## 2017-03-21 MED ORDER — NAPROXEN 250 MG PO TABS: 250.0000 mg | ORAL_TABLET | Freq: Two times a day (BID) | ORAL | 0 refills | Status: DC

## 2017-03-21 NOTE — ED Notes (Signed)
Bed: [facility] Expected date:  Expected time:  Means of arrival:  Comments: 

## 2017-03-21 NOTE — ED Notes (Signed)
Bed: [facility] Expected date:  Expected time:  Means of arrival:  Comments: 
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
## 2017-04-07 ENCOUNTER — Emergency Department (HOSPITAL_COMMUNITY): Payer: Self-pay

## 2017-04-07 ENCOUNTER — Emergency Department (HOSPITAL_COMMUNITY)
Admission: EM | Admit: 2017-04-07 | Discharge: 2017-04-07 | Disposition: A | Discharge status: Home / Self Care | Payer: Self-pay | Attending: Emergency Medicine | Admitting: Emergency Medicine

## 2017-04-07 ENCOUNTER — Encounter (HOSPITAL_COMMUNITY): Payer: Self-pay | Admitting: *Deleted

## 2017-04-07 DIAGNOSIS — Y929 Unspecified place or not applicable: Secondary | ICD-10-CM | POA: Insufficient documentation

## 2017-04-07 DIAGNOSIS — M62830 Muscle spasm of back: Secondary | ICD-10-CM | POA: Insufficient documentation

## 2017-04-07 DIAGNOSIS — Z79899 Other long term (current) drug therapy: Secondary | ICD-10-CM | POA: Insufficient documentation

## 2017-04-07 DIAGNOSIS — S92422A Displaced fracture of distal phalanx of left great toe, initial encounter for closed fracture: Secondary | ICD-10-CM | POA: Insufficient documentation

## 2017-04-07 DIAGNOSIS — Y999 Unspecified external cause status: Secondary | ICD-10-CM | POA: Insufficient documentation

## 2017-04-07 DIAGNOSIS — X509XXA Other and unspecified overexertion or strenuous movements or postures, initial encounter: Secondary | ICD-10-CM | POA: Insufficient documentation

## 2017-04-07 DIAGNOSIS — Y9302 Activity, running: Secondary | ICD-10-CM | POA: Insufficient documentation

## 2017-04-07 DIAGNOSIS — S92425A Nondisplaced fracture of distal phalanx of left great toe, initial encounter for closed fracture: Secondary | ICD-10-CM

## 2017-04-07 MED ORDER — MELOXICAM 7.5 MG PO TABS
7.5000 mg | ORAL_TABLET | Freq: Once | ORAL | Status: AC
  Administered 2017-04-07: 7.5 mg via ORAL
  Filled 2017-04-07: qty 1

## 2017-04-07 MED ORDER — CYCLOBENZAPRINE HCL 10 MG PO TABS: 10.0000 mg | ORAL_TABLET | Freq: Every evening | ORAL | 0 refills | Status: AC | PRN

## 2017-04-07 MED ORDER — NAPROXEN 375 MG PO TABS: 375.0000 mg | ORAL_TABLET | Freq: Two times a day (BID) | ORAL | 0 refills | Status: AC

## 2017-04-07 NOTE — ED Notes (Signed)
Patient transported to X-ray 

## 2017-04-07 NOTE — ED Notes (Signed)
EDPA Provider at bedside. 
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
## 2017-04-07 NOTE — Discharge Instructions (Signed)
Please read instructions below.  Schedule an [facility] with [facility]  to establish primary care and follow up on your ongoing back pain.  You can take naproxen 2 times per day, with food, as needed for pain.  You can take flexeril at night as needed for spasm. Do not drive or drink alcohol with this medication. Apply heat to your back to relax the spasm, massage after heat will also help. You can also apply ice for 20 minutes at a time. Drink plenty of water. Wear the walking shoe as needed for discomfort. Apply ice to your toe for 15 minutes at a time. Your toe shoe begin to feel better in the next couple days. Return to ER if new numbness or tingling in your arms or legs, inability to urinate, inability to hold your bowels, or weakness in your extremities.
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
## 2017-04-07 NOTE — ED Notes (Signed)
Post of shoe intact. Good cms

## 2017-06-16 IMAGING — CR DG FOOT COMPLETE 3+V*L*
3 series · 3 of 3 positions shown · non-contrast
Comparison: None.

CLINICAL DATA: Pain in first MTP for 2 weeks.

EXAM:
LEFT FOOT - COMPLETE 3+ VIEW

[x foot ap left]
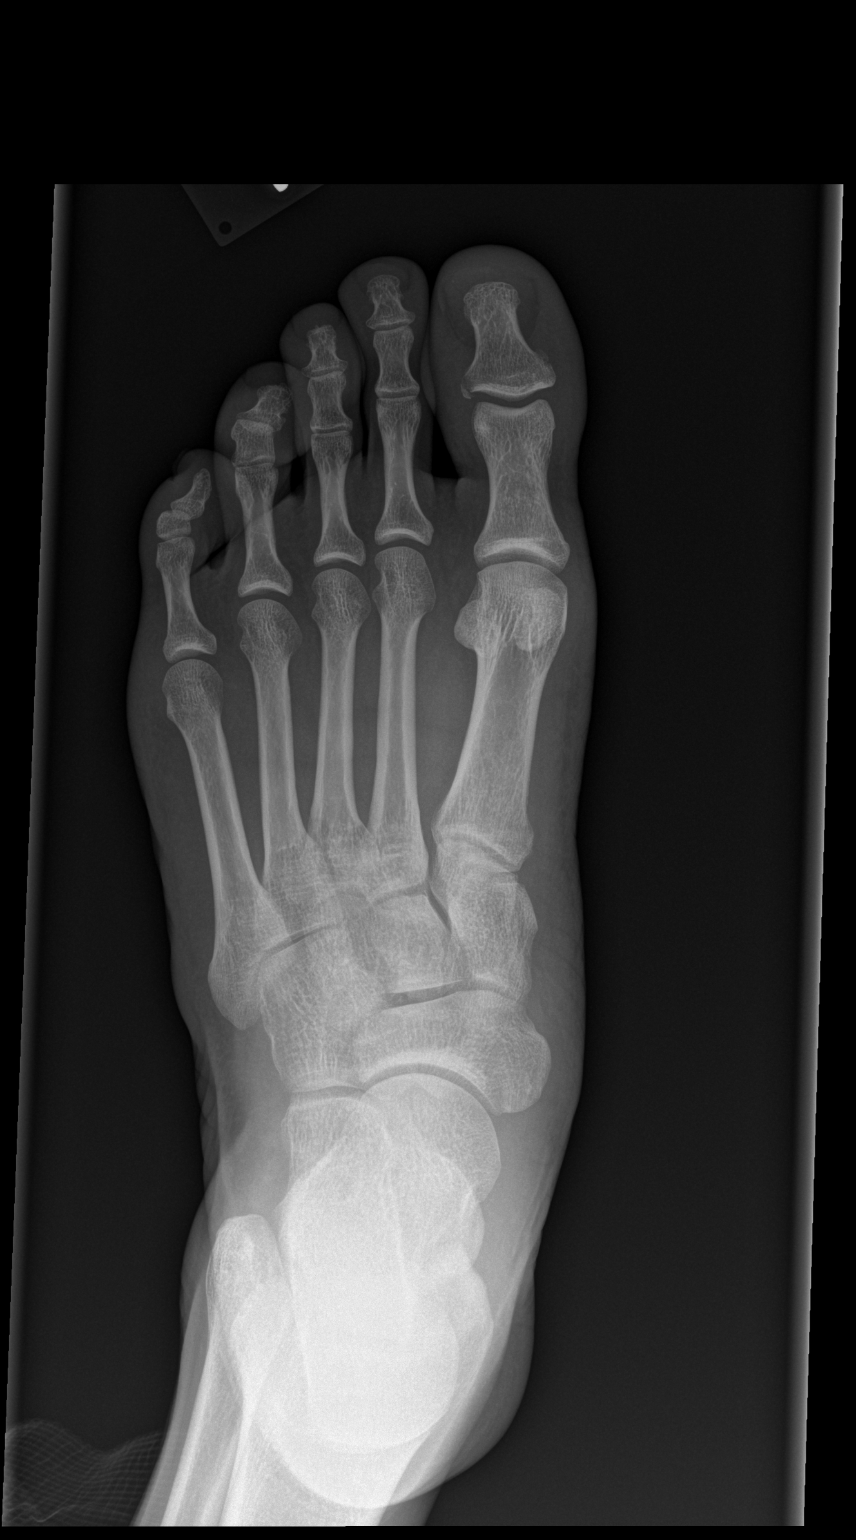

[x foot obl left]
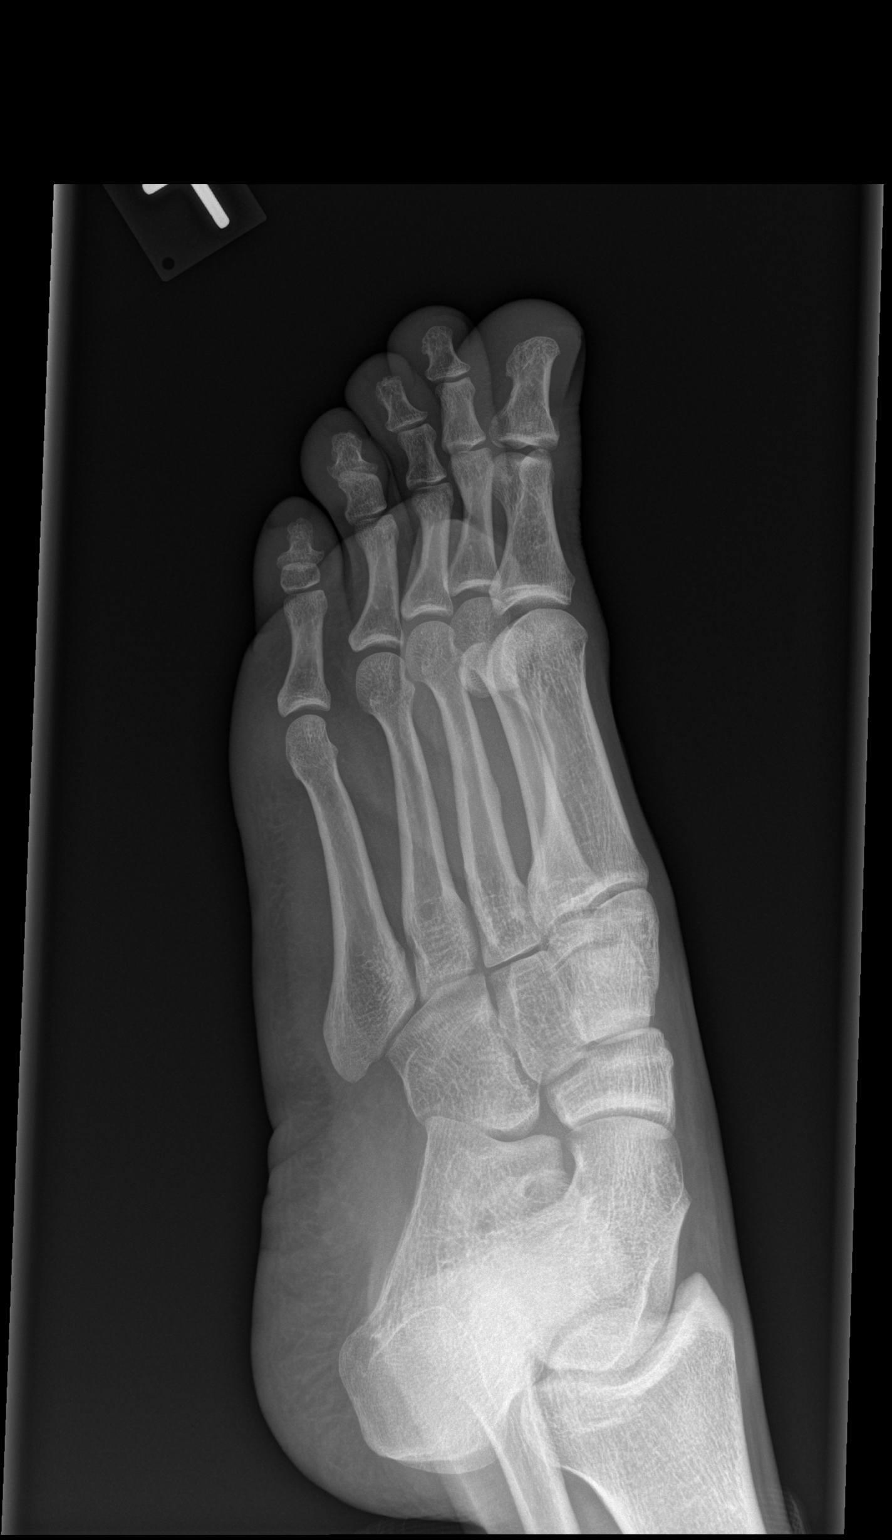

[x foot lat left]
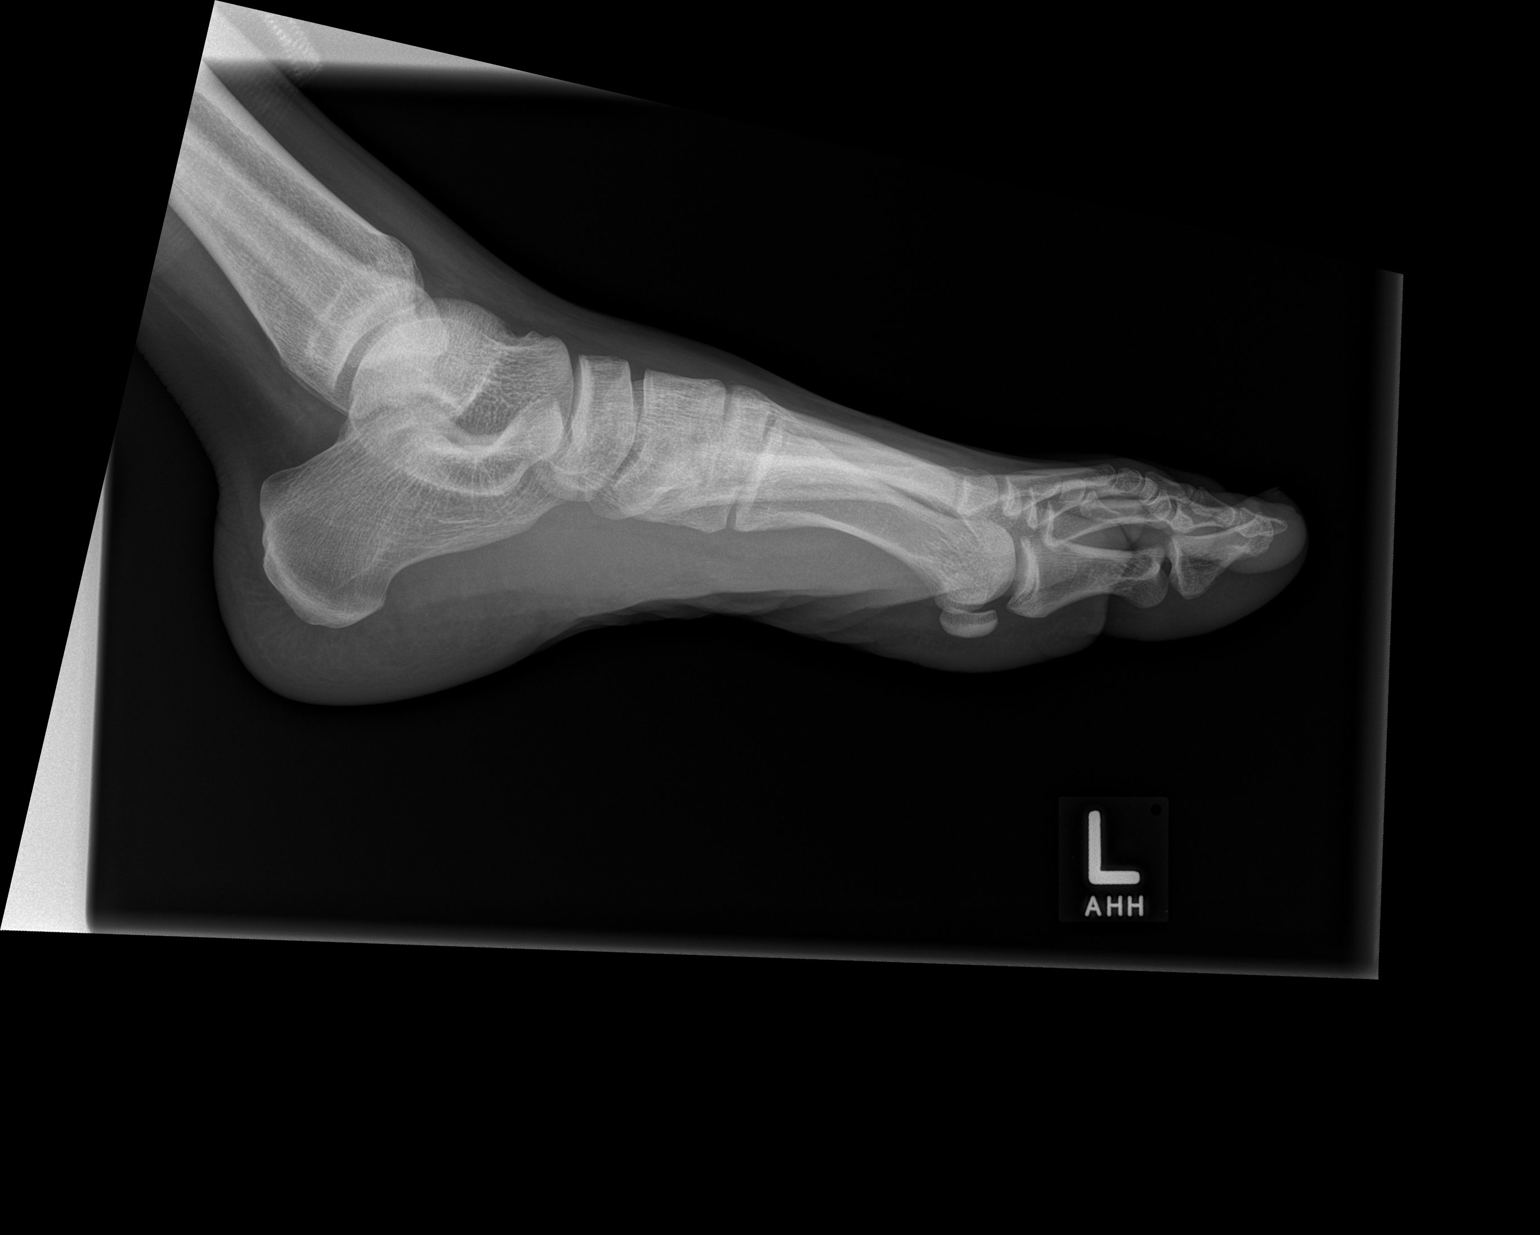

[3 of 3 positions shown; findings below may reference images not displayed]

FINDINGS: Fracture are through the proximal lateral aspect of the first distal
phalanx. No abnormalities seen at the first MTP joint. No other
abnormalities.
IMPRESSION: Fracture through the proximal lateral aspect of the first distal
phalanx. This is an age-indeterminate finding. Recommend correlation
in this region for focal pain. I suspect it may be subacute.
